# Patient Record
Sex: Female | Born: 1935 | Race: White | Hispanic: No | Smoking: Former smoker
Health system: Southern US, Community
[De-identification: ages and names within clinical notes are randomized; demographics above are authoritative.]

## PROBLEM LIST (undated history)

## (undated) DIAGNOSIS — R4182 Altered mental status, unspecified: Secondary | ICD-10-CM

## (undated) DIAGNOSIS — R0989 Other specified symptoms and signs involving the circulatory and respiratory systems: Secondary | ICD-10-CM

## (undated) DIAGNOSIS — M069 Rheumatoid arthritis, unspecified: Secondary | ICD-10-CM

## (undated) DIAGNOSIS — S72143A Displaced intertrochanteric fracture of unspecified femur, initial encounter for closed fracture: Secondary | ICD-10-CM

## (undated) DIAGNOSIS — K5900 Constipation, unspecified: Secondary | ICD-10-CM

## (undated) DIAGNOSIS — I10 Essential (primary) hypertension: Secondary | ICD-10-CM

## (undated) DIAGNOSIS — I491 Atrial premature depolarization: Secondary | ICD-10-CM

## (undated) DIAGNOSIS — J301 Allergic rhinitis due to pollen: Secondary | ICD-10-CM

## (undated) DIAGNOSIS — N390 Urinary tract infection, site not specified: Secondary | ICD-10-CM

## (undated) DIAGNOSIS — H25039 Anterior subcapsular polar age-related cataract, unspecified eye: Secondary | ICD-10-CM

## (undated) DIAGNOSIS — G309 Alzheimer's disease, unspecified: Secondary | ICD-10-CM

## (undated) DIAGNOSIS — E039 Hypothyroidism, unspecified: Secondary | ICD-10-CM

## (undated) DIAGNOSIS — R634 Abnormal weight loss: Secondary | ICD-10-CM

## (undated) DIAGNOSIS — R269 Unspecified abnormalities of gait and mobility: Secondary | ICD-10-CM

## (undated) DIAGNOSIS — M21619 Bunion of unspecified foot: Secondary | ICD-10-CM

## (undated) DIAGNOSIS — K624 Stenosis of anus and rectum: Secondary | ICD-10-CM

## (undated) DIAGNOSIS — K644 Residual hemorrhoidal skin tags: Secondary | ICD-10-CM

## (undated) DIAGNOSIS — R32 Unspecified urinary incontinence: Secondary | ICD-10-CM

## (undated) DIAGNOSIS — L989 Disorder of the skin and subcutaneous tissue, unspecified: Secondary | ICD-10-CM

## (undated) DIAGNOSIS — M545 Low back pain: Secondary | ICD-10-CM

## (undated) DIAGNOSIS — M5430 Sciatica, unspecified side: Secondary | ICD-10-CM

## (undated) DIAGNOSIS — M810 Age-related osteoporosis without current pathological fracture: Secondary | ICD-10-CM

## (undated) DIAGNOSIS — Z8719 Personal history of other diseases of the digestive system: Secondary | ICD-10-CM

## (undated) DIAGNOSIS — H3530 Unspecified macular degeneration: Secondary | ICD-10-CM

## (undated) DIAGNOSIS — F0280 Dementia in other diseases classified elsewhere without behavioral disturbance: Secondary | ICD-10-CM

## (undated) DIAGNOSIS — M8448XA Pathological fracture, other site, initial encounter for fracture: Secondary | ICD-10-CM

## (undated) DIAGNOSIS — M2040 Other hammer toe(s) (acquired), unspecified foot: Secondary | ICD-10-CM

## (undated) DIAGNOSIS — K602 Anal fissure, unspecified: Secondary | ICD-10-CM

## (undated) DIAGNOSIS — R627 Adult failure to thrive: Principal | ICD-10-CM

## (undated) DIAGNOSIS — K8020 Calculus of gallbladder without cholecystitis without obstruction: Secondary | ICD-10-CM

## (undated) HISTORY — DX: Unspecified abnormalities of gait and mobility: R26.9

## (undated) HISTORY — DX: Essential (primary) hypertension: I10

## (undated) HISTORY — DX: Age-related osteoporosis without current pathological fracture: M81.0

## (undated) HISTORY — DX: Anal fissure, unspecified: K60.2

## (undated) HISTORY — DX: Displaced intertrochanteric fracture of unspecified femur, initial encounter for closed fracture: S72.143A

## (undated) HISTORY — DX: Alzheimer's disease, unspecified: G30.9

## (undated) HISTORY — DX: Calculus of gallbladder without cholecystitis without obstruction: K80.20

## (undated) HISTORY — DX: Anterior subcapsular polar age-related cataract, unspecified eye: H25.039

## (undated) HISTORY — DX: Unspecified urinary incontinence: R32

## (undated) HISTORY — DX: Atrial premature depolarization: I49.1

## (undated) HISTORY — DX: Abnormal weight loss: R63.4

## (undated) HISTORY — DX: Disorder of the skin and subcutaneous tissue, unspecified: L98.9

## (undated) HISTORY — DX: Bunion of unspecified foot: M21.619

## (undated) HISTORY — DX: Sciatica, unspecified side: M54.30

## (undated) HISTORY — DX: Dementia in other diseases classified elsewhere without behavioral disturbance: F02.80

## (undated) HISTORY — DX: Rheumatoid arthritis, unspecified: M06.9

## (undated) HISTORY — DX: Constipation, unspecified: K59.00

## (undated) HISTORY — DX: Residual hemorrhoidal skin tags: K64.4

## (undated) HISTORY — DX: Pathological fracture, other site, initial encounter for fracture: M84.48XA

## (undated) HISTORY — DX: Hypothyroidism, unspecified: E03.9

## (undated) HISTORY — DX: Low back pain: M54.5

## (undated) HISTORY — DX: Stenosis of anus and rectum: K62.4

## (undated) HISTORY — DX: Personal history of other diseases of the digestive system: Z87.19

## (undated) HISTORY — DX: Allergic rhinitis due to pollen: J30.1

## (undated) HISTORY — DX: Adult failure to thrive: R62.7

## (undated) HISTORY — DX: Unspecified macular degeneration: H35.30

## (undated) HISTORY — DX: Other specified symptoms and signs involving the circulatory and respiratory systems: R09.89

## (undated) HISTORY — DX: Urinary tract infection, site not specified: N39.0

## (undated) HISTORY — DX: Other hammer toe(s) (acquired), unspecified foot: M20.40

## (undated) HISTORY — DX: Altered mental status, unspecified: R41.82

---

## 1980-06-18 HISTORY — PX: HAMMER TOE SURGERY: SHX385

## 2004-06-18 DIAGNOSIS — M2040 Other hammer toe(s) (acquired), unspecified foot: Secondary | ICD-10-CM

## 2004-06-18 DIAGNOSIS — H25039 Anterior subcapsular polar age-related cataract, unspecified eye: Secondary | ICD-10-CM

## 2004-06-18 DIAGNOSIS — K5900 Constipation, unspecified: Secondary | ICD-10-CM

## 2004-06-18 DIAGNOSIS — E039 Hypothyroidism, unspecified: Secondary | ICD-10-CM

## 2004-06-18 DIAGNOSIS — I10 Essential (primary) hypertension: Secondary | ICD-10-CM

## 2004-06-18 DIAGNOSIS — K624 Stenosis of anus and rectum: Secondary | ICD-10-CM

## 2004-06-18 HISTORY — DX: Hypothyroidism, unspecified: E03.9

## 2004-06-18 HISTORY — DX: Constipation, unspecified: K59.00

## 2004-06-18 HISTORY — DX: Other hammer toe(s) (acquired), unspecified foot: M20.40

## 2004-06-18 HISTORY — DX: Stenosis of anus and rectum: K62.4

## 2004-06-18 HISTORY — DX: Essential (primary) hypertension: I10

## 2004-06-18 HISTORY — DX: Anterior subcapsular polar age-related cataract, unspecified eye: H25.039

## 2005-06-18 DIAGNOSIS — M810 Age-related osteoporosis without current pathological fracture: Secondary | ICD-10-CM

## 2005-06-18 DIAGNOSIS — J301 Allergic rhinitis due to pollen: Secondary | ICD-10-CM

## 2005-06-18 HISTORY — DX: Age-related osteoporosis without current pathological fracture: M81.0

## 2005-06-18 HISTORY — DX: Allergic rhinitis due to pollen: J30.1

## 2005-06-18 HISTORY — PX: CHOLECYSTECTOMY, LAPAROSCOPIC: SHX56

## 2005-10-01 ENCOUNTER — Encounter (INDEPENDENT_AMBULATORY_CARE_PROVIDER_SITE_OTHER): Payer: Self-pay | Admitting: *Deleted

## 2005-10-01 ENCOUNTER — Ambulatory Visit (HOSPITAL_COMMUNITY): Admission: RE | Admit: 2005-10-01 | Discharge: 2005-10-02 | Payer: Self-pay | Admitting: Surgery

## 2005-10-16 DIAGNOSIS — K8020 Calculus of gallbladder without cholecystitis without obstruction: Secondary | ICD-10-CM

## 2005-10-16 HISTORY — DX: Calculus of gallbladder without cholecystitis without obstruction: K80.20

## 2007-01-13 ENCOUNTER — Encounter (INDEPENDENT_AMBULATORY_CARE_PROVIDER_SITE_OTHER): Payer: Self-pay | Admitting: Surgery

## 2007-01-13 ENCOUNTER — Ambulatory Visit (HOSPITAL_COMMUNITY): Admission: RE | Admit: 2007-01-13 | Discharge: 2007-01-13 | Payer: Self-pay | Admitting: Surgery

## 2007-06-19 DIAGNOSIS — I491 Atrial premature depolarization: Secondary | ICD-10-CM

## 2007-06-19 DIAGNOSIS — S72143A Displaced intertrochanteric fracture of unspecified femur, initial encounter for closed fracture: Secondary | ICD-10-CM

## 2007-06-19 HISTORY — DX: Atrial premature depolarization: I49.1

## 2007-06-19 HISTORY — DX: Displaced intertrochanteric fracture of unspecified femur, initial encounter for closed fracture: S72.143A

## 2007-08-17 HISTORY — PX: ORIF HIP FRACTURE: SHX2125

## 2007-08-21 ENCOUNTER — Inpatient Hospital Stay (HOSPITAL_COMMUNITY): Admission: EM | Admit: 2007-08-21 | Discharge: 2007-08-26 | Payer: Self-pay | Admitting: Emergency Medicine

## 2008-03-26 ENCOUNTER — Ambulatory Visit: Payer: Self-pay | Admitting: Vascular Surgery

## 2008-06-18 DIAGNOSIS — M5430 Sciatica, unspecified side: Secondary | ICD-10-CM

## 2008-06-18 DIAGNOSIS — M8448XA Pathological fracture, other site, initial encounter for fracture: Secondary | ICD-10-CM

## 2008-06-18 HISTORY — DX: Sciatica, unspecified side: M54.30

## 2008-06-18 HISTORY — DX: Pathological fracture, other site, initial encounter for fracture: M84.48XA

## 2009-05-18 HISTORY — PX: WRIST ARTHROTOMY: SHX1089

## 2009-06-08 ENCOUNTER — Encounter: Admission: RE | Admit: 2009-06-08 | Discharge: 2009-06-08 | Payer: Self-pay | Admitting: Internal Medicine

## 2009-06-09 ENCOUNTER — Inpatient Hospital Stay (HOSPITAL_COMMUNITY): Admission: EM | Admit: 2009-06-09 | Discharge: 2009-06-14 | Payer: Self-pay | Admitting: Orthopaedic Surgery

## 2009-06-09 DIAGNOSIS — M009 Pyogenic arthritis, unspecified: Secondary | ICD-10-CM

## 2009-06-12 ENCOUNTER — Ambulatory Visit: Payer: Self-pay | Admitting: Infectious Diseases

## 2009-06-18 DIAGNOSIS — M069 Rheumatoid arthritis, unspecified: Secondary | ICD-10-CM

## 2009-06-18 DIAGNOSIS — K644 Residual hemorrhoidal skin tags: Secondary | ICD-10-CM

## 2009-06-18 HISTORY — DX: Residual hemorrhoidal skin tags: K64.4

## 2009-06-18 HISTORY — DX: Rheumatoid arthritis, unspecified: M06.9

## 2009-07-13 ENCOUNTER — Ambulatory Visit: Payer: Self-pay | Admitting: Infectious Diseases

## 2009-07-13 DIAGNOSIS — E039 Hypothyroidism, unspecified: Secondary | ICD-10-CM | POA: Insufficient documentation

## 2009-07-13 DIAGNOSIS — I10 Essential (primary) hypertension: Secondary | ICD-10-CM | POA: Insufficient documentation

## 2009-07-13 DIAGNOSIS — S72009A Fracture of unspecified part of neck of unspecified femur, initial encounter for closed fracture: Secondary | ICD-10-CM | POA: Insufficient documentation

## 2010-01-22 ENCOUNTER — Inpatient Hospital Stay (HOSPITAL_COMMUNITY)
Admission: EM | Admit: 2010-01-22 | Discharge: 2010-01-24 | Payer: Self-pay | Source: Home / Self Care | Admitting: Emergency Medicine

## 2010-01-23 ENCOUNTER — Encounter (INDEPENDENT_AMBULATORY_CARE_PROVIDER_SITE_OTHER): Payer: Self-pay | Admitting: Family Medicine

## 2010-01-23 ENCOUNTER — Ambulatory Visit: Payer: Self-pay | Admitting: Vascular Surgery

## 2010-01-24 ENCOUNTER — Encounter (INDEPENDENT_AMBULATORY_CARE_PROVIDER_SITE_OTHER): Payer: Self-pay | Admitting: Family Medicine

## 2010-06-18 DIAGNOSIS — K602 Anal fissure, unspecified: Secondary | ICD-10-CM

## 2010-06-18 DIAGNOSIS — R269 Unspecified abnormalities of gait and mobility: Secondary | ICD-10-CM

## 2010-06-18 HISTORY — DX: Unspecified abnormalities of gait and mobility: R26.9

## 2010-06-18 HISTORY — DX: Anal fissure, unspecified: K60.2

## 2010-07-18 NOTE — Assessment & Plan Note (Signed)
Summary: [facility] NEED CHART WRIST INFECTION/[facility] 12INPT SER   CC:  [facility].  History of Present Illness: 77 yo F with adm to [facility] from SNF with R wrist pyarthrosis. She had an arthrocentesis in office and was admitted and uncerwent I & D 06-09-09. She was d/c to snf to complete 21 days of IV anbx (vanco and ceftriaxone).  Has completed antibiotics, pic line is out, no fevers or chills, can move wrist normally, no pain,   Preventive Screening-Counseling & Management  Alcohol-Tobacco     Alcohol drinks/day: 1     Alcohol type: wine     Smoking Status: never   Updated Prior Medication List: MIRALAX  POWD (POLYETHYLENE GLYCOL 3350) use as directed ADVIL 200 MG TABS (IBUPROFEN) take one tablet daily METOPROLOL TARTRATE 25 MG TABS (METOPROLOL TARTRATE) take one half  two times a day SM CALCIUM CITRATE-VIT D 315-200 MG-UNIT TABS (CALCIUM CITRATE-VITAMIN D) take one daily NORVASC 5 MG TABS (AMLODIPINE BESYLATE) take one dose daily ASPIR-LOW 81 MG TBEC (ASPIRIN) take one daily HYDROCHLOROTHIAZIDE 25 MG TABS (HYDROCHLOROTHIAZIDE) take one daily LEVOTHROID 50 MCG TABS (LEVOTHYROXINE SODIUM) take one tablet each am MULTIVITAMINS  CAPS (MULTIPLE VITAMIN) take one capsule daily  Current Allergies (reviewed today): ! CODEINE Past History:  Past Medical History: Current Problems:  PYOGENIC ARTHRITIS, FOREARM (ICD-711.03) UNSPECIFIED HYPOTHYROIDISM (ICD-244.9) HIP FRACTURE, RIGHT (ICD-820.8) ESSENTIAL HYPERTENSION (ICD-401.9)  Current Medications (verified): 1)  Miralax  Powd (Polyethylene Glycol 3350) .... Use As Directed 2)  Advil 200 Mg Tabs (Ibuprofen) .... Take One Tablet Daily 3)  Metoprolol Tartrate 25 Mg Tabs (Metoprolol Tartrate) .... Take One Half  Two Times A Day 4)  Sm Calcium Citrate-Vit D 315-200 Mg-Unit Tabs (Calcium Citrate-Vitamin D) .... Take One Daily 5)  Norvasc 5 Mg Tabs (Amlodipine Besylate) .... Take One Dose Daily 6)  Aspir-Low 81 Mg Tbec (Aspirin) .... Take One Daily 7)   Hydrochlorothiazide 25 Mg Tabs (Hydrochlorothiazide) .... Take One Daily 8)  Levothroid 50 Mcg Tabs (Levothyroxine Sodium) .... Take One Tablet Each Am 9)  Multivitamins  Caps (Multiple Vitamin) .... Take One Capsule Daily  Allergies (verified): 1)  ! Codeine   Family History: non-contributory  Social History: Never Smoked Retired lives in independent living SNF Alcohol use-yes, 1 glass of wine/night.  has a cat  Vital Signs:  Patient profile:   77 year old female Height:      0 inches (0 cm) Weight:      0 pounds (0 kg) Temp:     96.7 degrees F (35.94 degrees C) oral Pulse rate:   74 / minute BP sitting:   153 / 51  (left arm)  Vitals Entered By: Starleen Arms CMA (July 13, 2009 4:02 PM) CC: [facility] Is Patient Diabetic? No Pain Assessment Patient in pain? no       Does patient need assistance? Functional Status Self care Ambulation Impaired:Risk for fall, Wheelchair   Physical Exam  General:  well-developed, well-nourished, and well-hydrated.   Eyes:  pupils equal, pupils round, and pupils reactive to light.   Mouth:  pharynx pink and moist and no exudates.   Neck:  no masses.   Lungs:  normal respiratory effort and normal breath sounds.   Heart:  normal rate and regular rhythm.   Abdomen:  soft, non-tender, and normal bowel sounds.   Extremities:  R wrist- well healed, nontender, no fluctuance, no erythema.    Impression & Recommendations:  Problem # 1:  PYOGENIC ARTHRITIS, FOREARM (ICD-711.03)  she is doing  very well. has completed her antibiotics and has healed beautifully. She needs no further antibiotics.  Spoke with her that the etiology of this was, unfortunately, unknown. She will f/u with me as needed.  Her updated medication list for this problem includes:    Aspir-low 81 Mg Tbec (Aspirin) .Marland Kitchen... Take one daily  Orders: New Patient Level III ([address])  Medications Added to Medication List This Visit: 1)  Miralax Powd (Polyethylene glycol  3350) .... Use as directed 2)  Advil 200 Mg Tabs (Ibuprofen) .... Take one tablet daily 3)  Metoprolol Tartrate 25 Mg Tabs (Metoprolol tartrate) .... Take one half  two times a day 4)  Sm Calcium Citrate-vit D 315-200 Mg-unit Tabs (Calcium citrate-vitamin d) .... Take one daily 5)  Norvasc 5 Mg Tabs (Amlodipine besylate) .... Take one dose daily 6)  Aspir-low 81 Mg Tbec (Aspirin) .... Take one daily 7)  Hydrochlorothiazide 25 Mg Tabs (Hydrochlorothiazide) .... Take one daily 8)  Levothroid 50 Mcg Tabs (Levothyroxine sodium) .... Take one tablet each am 9)  Multivitamins Caps (Multiple vitamin) .... Take one capsule daily

## 2010-07-18 NOTE — Miscellaneous (Signed)
Summary: HIPAA Restrictions  HIPAA Restrictions   Imported By: Florinda Marker 07/13/2009 16:15:51  _____________________________________________________________________  External Attachment:    Type:   Image     Comment:   External Document

## 2010-08-11 IMAGING — CR DG SHOULDER 2+V*R*
3 series · 3 of 3 positions shown · non-contrast
Comparison: None

CLINICAL DATA: Anterior shoulder pain.

RIGHT SHOULDER - 2+ VIEW

[view not recorded (1 of 3)]
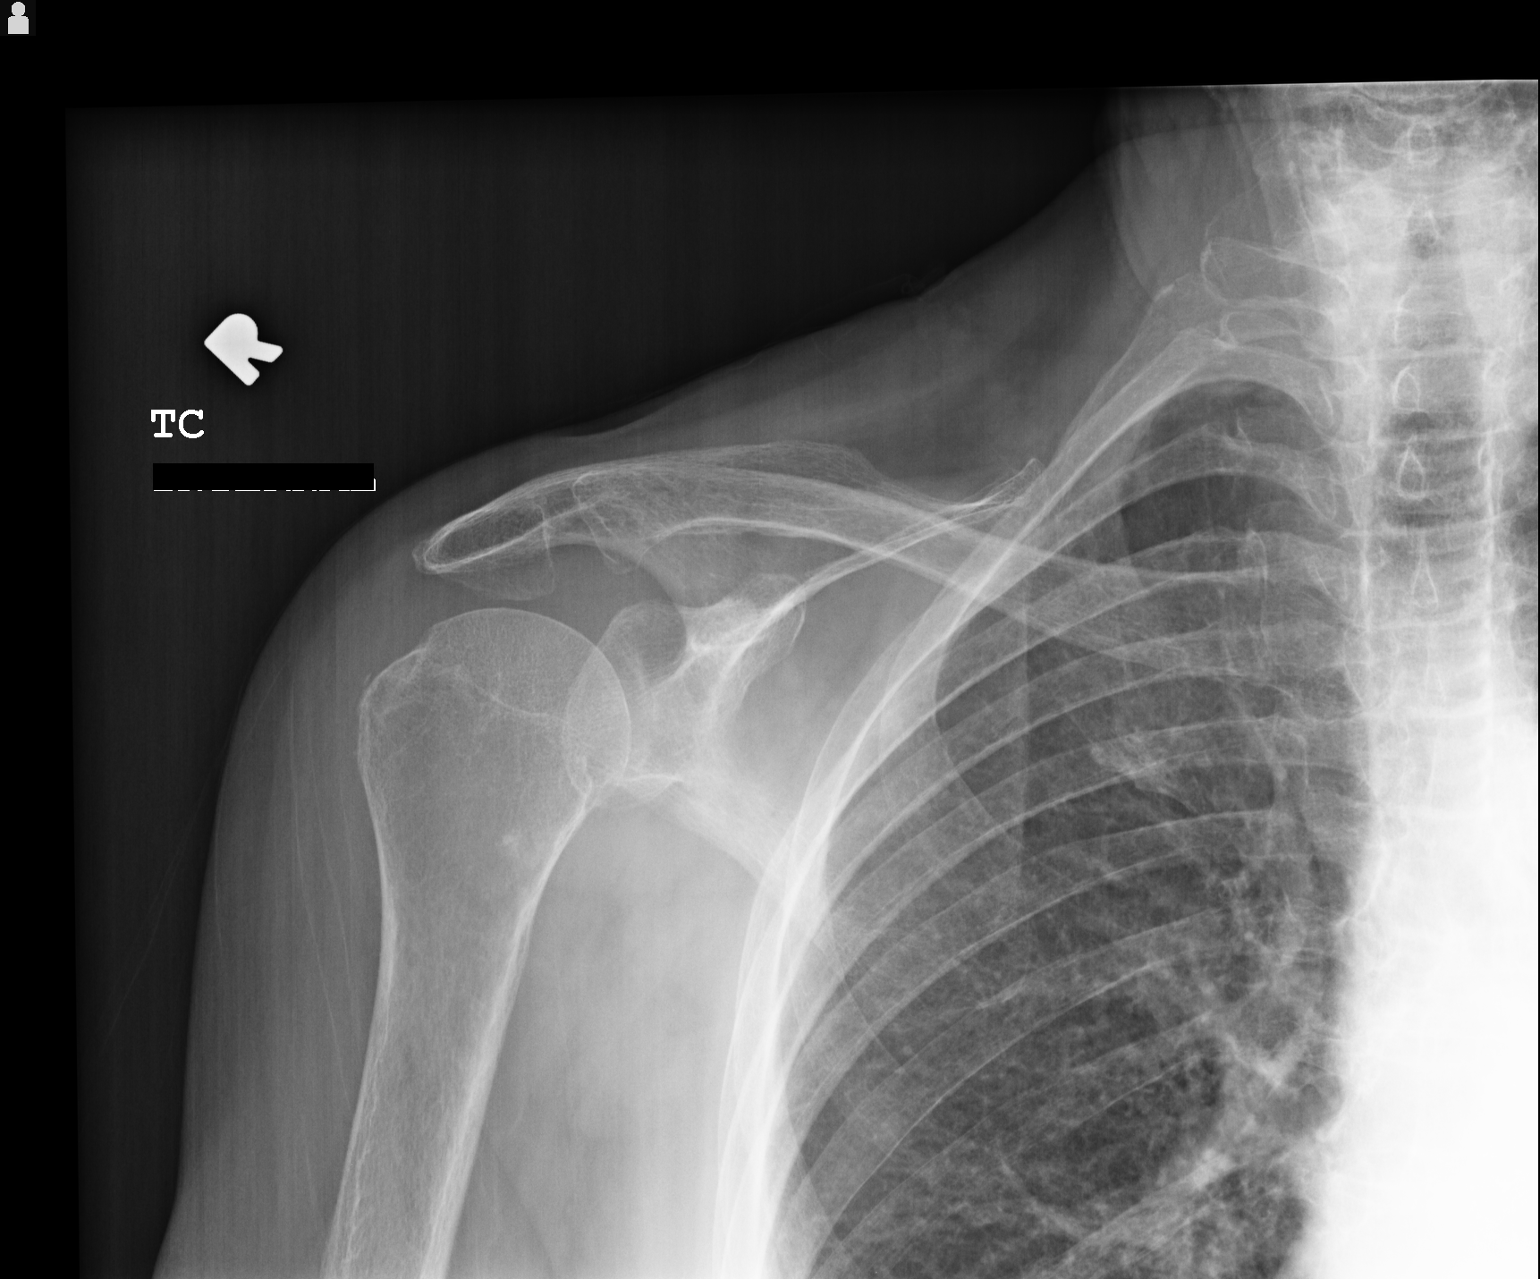

[view not recorded (2 of 3)]
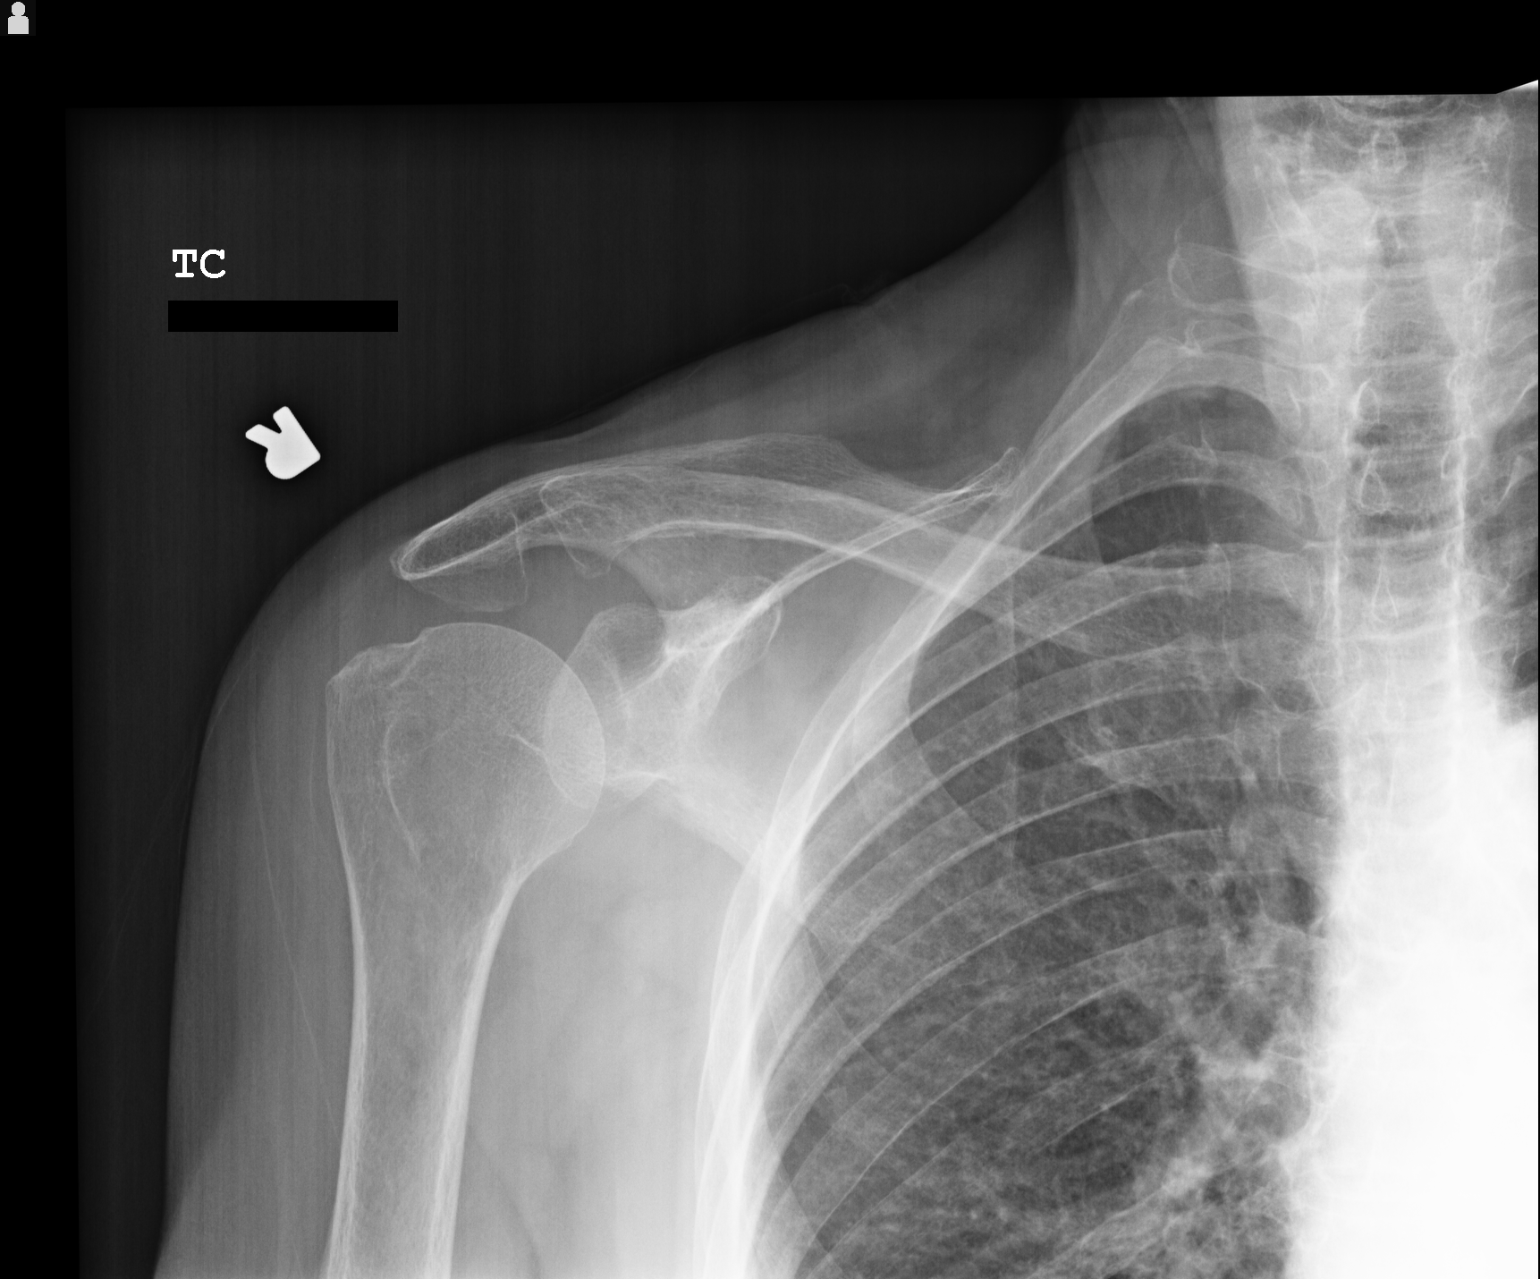

[view not recorded (3 of 3)]
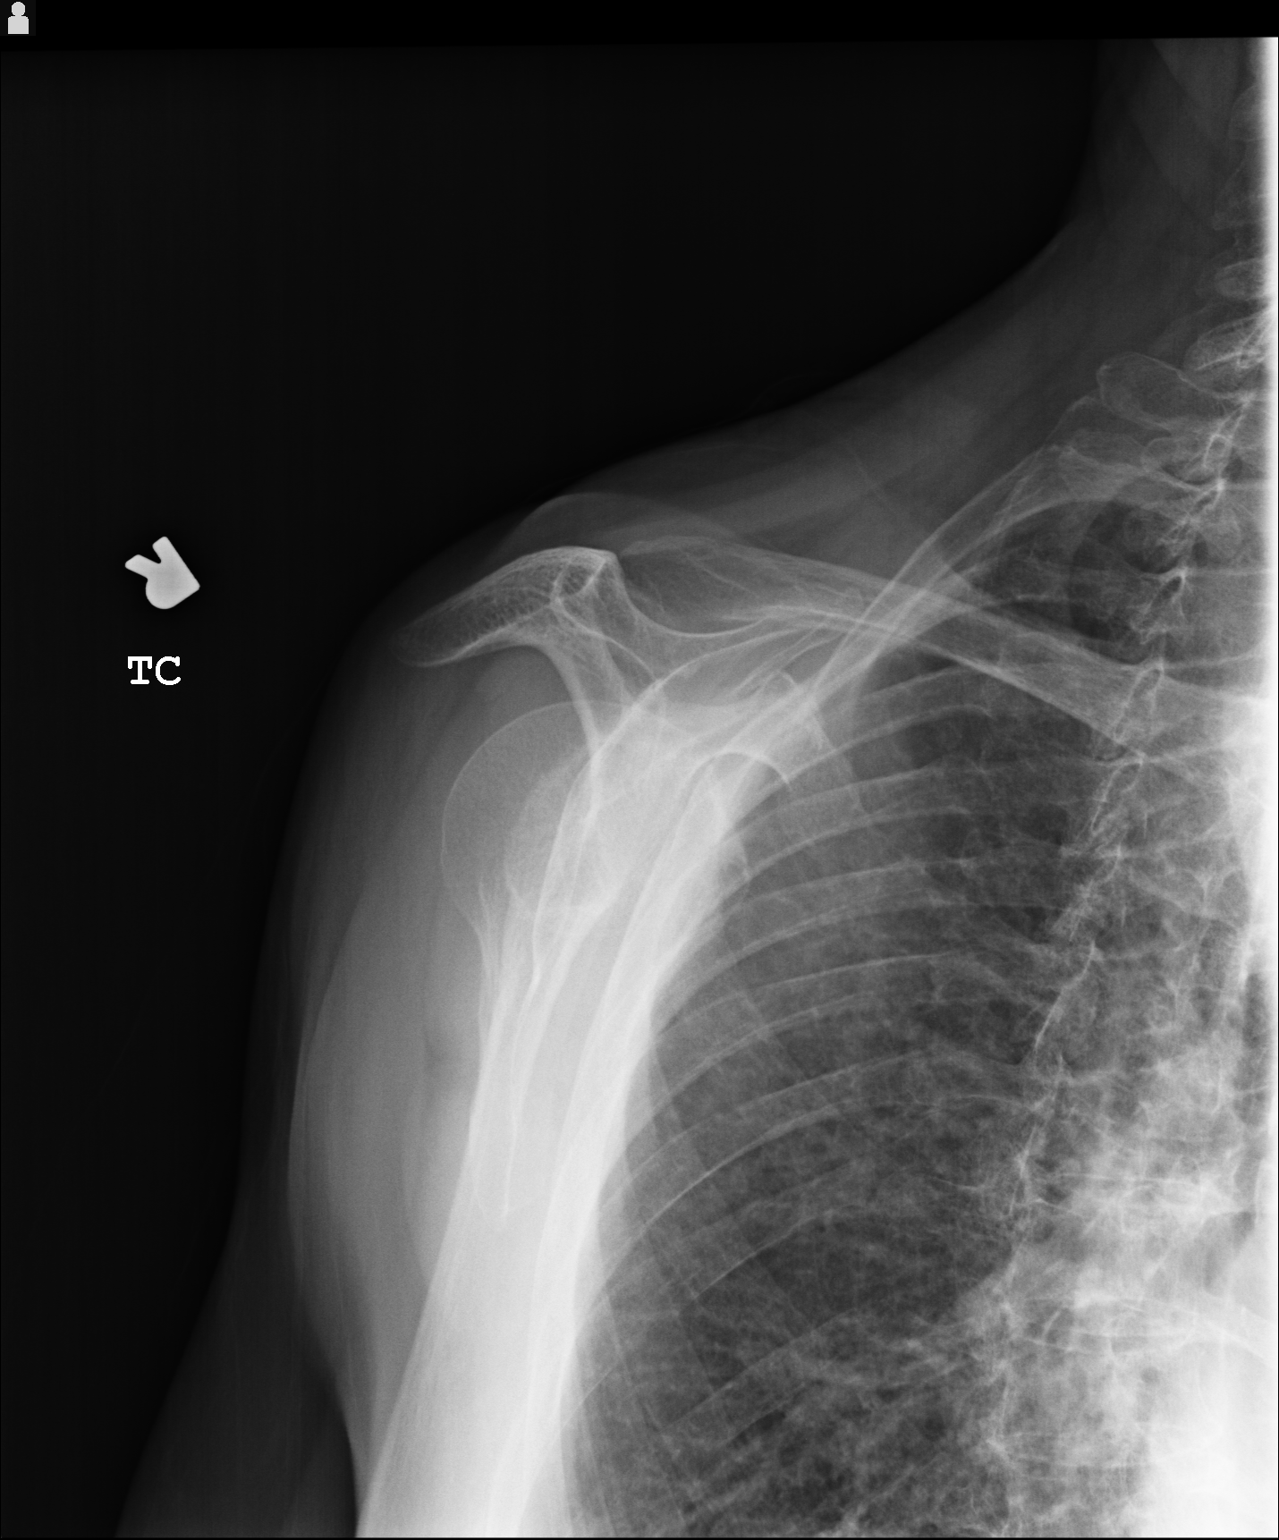

[3 of 3 positions shown; findings below may reference images not displayed]

FINDINGS: There is slight apparent widening of the right AC joint.
Recommend clinical correlation to exclude separation.  No fracture
or dislocation.  Mild osteopenia.  No significant degenerative
changes.  Visualized right hemithorax unremarkable.
IMPRESSION: Mild widening at the right AC joint.  Recommend clinical
correlation for trauma to exclude AC joint separation.

## 2010-09-01 LAB — DIFFERENTIAL
Basophils Absolute: 0 10*3/uL (ref 0.0–0.1)
Basophils Relative: 0 % (ref 0–1)
Eosinophils Absolute: 0 10*3/uL (ref 0.0–0.7)
Lymphocytes Relative: 15 % (ref 12–46)
Lymphs Abs: 1 10*3/uL (ref 0.7–4.0)
Monocytes Absolute: 0.4 10*3/uL (ref 0.1–1.0)
Monocytes Relative: 5 % (ref 3–12)
Monocytes Relative: 9 % (ref 3–12)
Neutro Abs: 5.4 10*3/uL (ref 1.7–7.7)
Neutrophils Relative %: 62 % (ref 43–77)
Neutrophils Relative %: 80 % — ABNORMAL HIGH (ref 43–77)

## 2010-09-01 LAB — BASIC METABOLIC PANEL
BUN: 12 mg/dL (ref 6–23)
BUN: 12 mg/dL (ref 6–23)
BUN: 16 mg/dL (ref 6–23)
CO2: 25 mEq/L (ref 19–32)
Calcium: 8.9 mg/dL (ref 8.4–10.5)
Calcium: 9.1 mg/dL (ref 8.4–10.5)
Chloride: 95 mEq/L — ABNORMAL LOW (ref 96–112)
Creatinine, Ser: 0.78 mg/dL (ref 0.4–1.2)
Creatinine, Ser: 0.78 mg/dL (ref 0.4–1.2)
Creatinine, Ser: 0.89 mg/dL (ref 0.4–1.2)
GFR calc Af Amer: >60 mL/min (ref >60)
GFR calc non Af Amer: >60 mL/min (ref >60)
Glucose, Bld: 75 mg/dL (ref 70–99)
Glucose, Bld: 91 mg/dL (ref 70–99)
Potassium: 3.5 mEq/L (ref 3.5–5.1)
Potassium: 4.8 mEq/L (ref 3.5–5.1)

## 2010-09-01 LAB — CARDIAC PANEL(CRET KIN+CKTOT+MB+TROPI)
CK, MB: 1.3 ng/mL (ref 0.3–4.0)
Relative Index: INVALID (ref 0.0–2.5)
Relative Index: INVALID (ref 0.0–2.5)
Total CK: 30 U/L (ref 7–177)
Troponin I: 0.02 ng/mL (ref 0.00–0.06)
Troponin I: 0.02 ng/mL (ref 0.00–0.06)

## 2010-09-01 LAB — CBC
Hemoglobin: 10.5 g/dL — ABNORMAL LOW (ref 12.0–15.0)
MCHC: 34.5 g/dL (ref 30.0–36.0)
MCHC: 34.7 g/dL (ref 30.0–36.0)
Platelets: 246 10*3/uL (ref 150–400)
Platelets: 270 10*3/uL (ref 150–400)
RBC: 3.14 MIL/uL — ABNORMAL LOW (ref 3.87–5.11)
RBC: 3.5 MIL/uL — ABNORMAL LOW (ref 3.87–5.11)
RDW: 14.3 % (ref 11.5–15.5)

## 2010-09-01 LAB — CK TOTAL AND CKMB (NOT AT ARMC): Total CK: 33 U/L (ref 7–177)

## 2010-09-01 LAB — URINALYSIS, ROUTINE W REFLEX MICROSCOPIC
Hgb urine dipstick: NEGATIVE
Ketones, ur: NEGATIVE mg/dL
Protein, ur: NEGATIVE mg/dL
Specific Gravity, Urine: 1.007 (ref 1.005–1.030)

## 2010-09-01 LAB — MRSA PCR SCREENING: MRSA by PCR: NEGATIVE

## 2010-09-01 LAB — TSH: TSH: 2.005 u[IU]/mL (ref 0.350–4.500)

## 2010-09-01 LAB — TROPONIN I: Troponin I: 0.01 ng/mL (ref 0.00–0.06)

## 2010-09-01 LAB — LIPID PANEL: Total CHOL/HDL Ratio: 2.5 RATIO

## 2010-09-17 DIAGNOSIS — H3530 Unspecified macular degeneration: Secondary | ICD-10-CM

## 2010-09-17 HISTORY — DX: Unspecified macular degeneration: H35.30

## 2010-09-18 LAB — BASIC METABOLIC PANEL
BUN: 7 mg/dL (ref 6–23)
BUN: 9 mg/dL (ref 6–23)
CO2: 29 mEq/L (ref 19–32)
Chloride: 88 mEq/L — ABNORMAL LOW (ref 96–112)
Chloride: 92 mEq/L — ABNORMAL LOW (ref 96–112)
Creatinine, Ser: 0.69 mg/dL (ref 0.4–1.2)
Creatinine, Ser: 0.74 mg/dL (ref 0.4–1.2)
GFR calc Af Amer: >60 mL/min (ref >60)
GFR calc Af Amer: >60 mL/min (ref >60)
GFR calc non Af Amer: >60 mL/min (ref >60)
GFR calc non Af Amer: >60 mL/min (ref >60)
Glucose, Bld: 98 mg/dL (ref 70–99)
Potassium: 2.8 mEq/L — ABNORMAL LOW (ref 3.5–5.1)
Potassium: 3.7 mEq/L (ref 3.5–5.1)

## 2010-09-18 LAB — CBC
HCT: 31.2 % — ABNORMAL LOW (ref 36.0–46.0)
HCT: 31.9 % — ABNORMAL LOW (ref 36.0–46.0)
HCT: 32.5 % — ABNORMAL LOW (ref 36.0–46.0)
Hemoglobin: 11.2 g/dL — ABNORMAL LOW (ref 12.0–15.0)
MCHC: 35.1 g/dL (ref 30.0–36.0)
MCV: 100 fL (ref 78.0–100.0)
MCV: 100.4 fL — ABNORMAL HIGH (ref 78.0–100.0)
Platelets: 254 10*3/uL (ref 150–400)
Platelets: 255 10*3/uL (ref 150–400)
RBC: 3.19 MIL/uL — ABNORMAL LOW (ref 3.87–5.11)
RBC: 3.24 MIL/uL — ABNORMAL LOW (ref 3.87–5.11)
RDW: 12.9 % (ref 11.5–15.5)
WBC: 10.9 10*3/uL — ABNORMAL HIGH (ref 4.0–10.5)
WBC: 13.1 10*3/uL — ABNORMAL HIGH (ref 4.0–10.5)
WBC: 8.3 10*3/uL (ref 4.0–10.5)

## 2010-09-18 LAB — ANAEROBIC CULTURE: Gram Stain: NONE SEEN

## 2010-09-18 LAB — RHEUMATOID FACTOR: Rhuematoid fact SerPl-aCnc: 20 IU/mL (ref 0–20)

## 2010-09-18 LAB — URIC ACID: Uric Acid, Serum: 2.5 mg/dL (ref 2.4–7.0)

## 2010-09-18 LAB — BODY FLUID CULTURE

## 2010-10-31 NOTE — Discharge Summary (Signed)
[name], [name]             ACCOUNT NO.:  [id number]   MEDICAL RECORD NO.:  [id number]          PATIENT TYPE:  INP   LOCATION:  1605                         FACILITY:  [facility]   PHYSICIAN:  Mark C. Ophelia Charter, M.D.    DATE OF BIRTH:  1924/05/20   DATE OF ADMISSION:  08/20/2007  DATE OF DISCHARGE:  08/26/2007                               DISCHARGE SUMMARY   FINAL DIAGNOSIS:  Fall with left intertrochanteric/subtrochanteric  comminuted fracture.   ADDITIONAL DIAGNOSES:  1. Acute blood loss anemia secondary to fracture.  2. Status post cholecystectomy.  3. Hammertoe surgery.  4. Hypothyroidism.  5. Osteoporosis.   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  She was at [employer] for the [employer] and caught her heel on the edge of carpet, suffering a  fall with a left intertrochanteric/subtrochanteric comminuted fracture.  She presented to the emergency room by ambulance with fracture.  Chest x-  ray showed some mild bibasilar atelectasis.   ADMISSION LABORATORY DATA:  Included normal chemistry panel with the  exception of a potassium of 3.1, glucose of 121 and a hemoglobin was  11.8 with a normal white count and normal differential; platelets were  291,000.   An EKG showed normal sinus rhythm with premature atrial complexes.   ADMISSION MEDICATIONS:  1. Synthroid 0.05 mg p.o. daily.  2. Hydrochlorothiazide 25 mg daily.  3. Multivitamins one a day.  4. Calcium 600 mg b.i.d.  5. Boniva once a month.  6. Norvasc 5 mg p.o. daily.   HOSPITAL COURSE:  The patient was admitted and after informed consent,  was taken to the operating room and underwent left intramedullary hip  screw fixation with Synthes device, 11 x 360-mm nail with proximal and  distal interlock; this a long rod and it was interlocked distally just  above the knee.  A 95-mm lag screw was used.  EBL intraoperatively was  400 mL.  General  anesthesia plus 15 mL of local.  Postoperatively, the  patient had some decreased urine output and 2 units of blood were given  for a low hemoglobin of 6.9 with acute blood loss anemia.  Oxygen was  left on and after 2 units, hemoglobin improved.  She made slow progress  with physical therapy and initially refused to have her catheter removed  and it was discussed that she would be a risk for UTI, etc.  Hemoglobin  was 9.1 and stable on August 24, 2007.  She was ambulatory to the hall and  back.  She was on Coumadin and received 4 mg and INR was 1.6 on August 25, 2007.  Plan was for discharge on 5 mg of Coumadin daily for 3 weeks,  which will stop on April 1.  She will receive ferrous sulfate 300 mg  p.o. b.i.d. until April 1 and also Colace stool softener.  She is taking  Percocet for pain.  She had a small amount of bloody drainage from her  incision the first couple of days postop; incision was dry on August 25, 2007.  The nurse at [facility] is to remove her staples in her hip at  the trochanter as well as just below the trochanter in the mid thigh and  also just above the knee laterally on September 02, 2007.  All staples are  to be removed on that date.  She is off aspirin until her Coumadin stops  on September 17, 2007 and then she can restart her 1 baby aspirin a day as  she previously was prescribed.  Office followup with Dr. Ophelia Charter for 3  weeks.  Postop x-ray showed good position and alignment.  In 3 weeks,  she will see Dr. Ophelia Charter in his office (office number is [phone]), [address] for followup x-rays and wound check.  Foley was removed  on August 25, 2007.  She will be on in-and-out q.6 h. if needed.  Hopefully, she will void without problems and will be discharged Tuesday  morning, August 26, 2007, after she works with Physical Therapy.   CONDITION AT DISCHARGE:  Improved.      Mark C. Ophelia Charter, M.D.  Electronically Signed     MCY/MEDQ  D:  08/25/2007  T:  08/26/2007  Job:   [id number]

## 2010-10-31 NOTE — Op Note (Signed)
[name], [name]             ACCOUNT NO.:  [id number]   MEDICAL RECORD NO.:  [id number]          PATIENT TYPE:  INP   LOCATION:  1518                         FACILITY:  [facility]   PHYSICIAN:  Mark C. Ophelia Charter, M.D.    DATE OF BIRTH:  [birth year]   DATE OF PROCEDURE:  08/21/2007  DATE OF DISCHARGE:                               OPERATIVE REPORT   PREOPERATIVE DIAGNOSES:  Left _intertrochanteric  subtrochanteric femur  fracture, comminuted.   POSTOPERATIVE DIAGNOSES:  Left intertrochanteric subtrochanteric femur  fracture, comminuted.   PROCEDURE:  Left intramedullary hip screw Synthes 11 x 360 nail with 95  mm interlock and distal interlock x2.   SURGEON:  Annell Greening, MD.   ESTIMATED BLOOD LOSS:  400 mL.   ANESTHESIA:  GOT plus 15 mL local.   COMPLICATIONS:  None.   DESCRIPTION OF PROCEDURE:  After induction of general anesthesia,  orotracheal intubation, the patient was placed on the fracture table  with traction applied, well leg holder, arms across the chest with  padding.  The leg was placed in traction, careful padding, distracted,  internally rotated, adducted and x-rays taken which demonstrated  reduction procedure was suggested. Lateral fluoroscopic view was  confirmed.  A time-out procedure was taken, hip was prepped with  DuraPrep, area was covered with towels.  A large __________ and Vi-Drape  was applied and then time out procedure repeated. A lateral incision was  made starting at the trochanter, extended proximally, the gluteus medius  fascia was split and K-wire was drilled in the trochanter.  Over reaming  for entry point and then placement of 360 rod based on the ruler placed  over the top of the hip.  The nail was passed across through the  comminuted subtrochanteric fracture area initially slightly posterior  and then aimed more anterior and then went directly down to the  fragments and ended up at the superior pole the patellar region. The rod  was driven  until it was in position where the line was even with the  inferior aspect of the neck. A lateral incision was made in the skin and  subtrochanteric region and the large screw was clamped down until under  fluoroscopy it was against the bone. The K wire was drilled up. It was  about in the middle of the head on AP and slightly posterior on the  lateral. Measurement of 95, screw was inserted, locked proximally and  then the aiming guide was removed. A lateral incision was made distally  under fluoroscopic visualization and freehand technique was used for  placing the two interlocks. The one through the slot went slightly  oblique and slightly posterior, we went through the slot and it was  tight. A transverse screw was placed and initially it was just above the  rod and then when it was backed out repositioned and then  went directly  through the rod. AP and lateral fluoroscopic pictures taken that  confirmed good position.  The fracture site  looked good.  After irrigation with saline solution, the deep fascia was  closed with #0 Vicryl,  2-0 Vicryl  in the subcutaneous tissue, skin  stapled closure, Marcaine infiltration in the skin,  postop dressing.  Instrument count and needle count was correct.      Mark C. Ophelia Charter, M.D.  Electronically Signed     MCY/MEDQ  D:  08/21/2007  T:  08/22/2007  Job:  [id number]

## 2010-10-31 NOTE — Procedures (Signed)
CAROTID DUPLEX EXAM   INDICATION:  Carotid bruit.   HISTORY:  Diabetes:  No.  Cardiac:  Atrial fibrillation.  Hypertension:  No.  Smoking:  Previous.  Previous Surgery:  CV History:  Amaurosis Fugax No, Paresthesias No, Hemiparesis No.                                       RIGHT             LEFT  Brachial systolic pressure:         180               174  Brachial Doppler waveforms:         Normal            Normal  Vertebral direction of flow:        Antegrade         Antegrade  DUPLEX VELOCITIES (cm/sec)  CCA peak systolic                   95                89  ECA peak systolic                   198               80  ICA peak systolic                   206               76  ICA end diastolic                   55                20  PLAQUE MORPHOLOGY:                  Calcific          Calcific  PLAQUE AMOUNT:                      Moderate          Mild  PLAQUE LOCATION:                    ICA/ECA           ICA/distal CCA   IMPRESSION:  1. Doppler velocities suggest a low-end 60-79% stenosis of the right      proximal-to-mid internal carotid artery which appears to be a      result of moderate plaque formations and vessel tortuosity.  2. 1-39% stenosis of the left internal carotid artery.   Preliminary report was faxed to [facility] on 03/26/08.   ___________________________________________  Quita Skye. Hart Rochester, M.D.   CH/MEDQ  D:  03/26/2008  T:  03/26/2008  Job:  [id number]

## 2010-10-31 NOTE — Op Note (Signed)
NAMEDAO, MEARNS             ACCOUNT NO.:  [id number]   MEDICAL RECORD NO.:  [id number]          PATIENT TYPE:  AMB   LOCATION:  SDS                          FACILITY:  [facility]   PHYSICIAN:  Currie Paris, M.D.DATE OF BIRTH:  1923/11/04   DATE OF PROCEDURE:  01/13/2007  DATE OF DISCHARGE:                               OPERATIVE REPORT   PREOPERATIVE DIAGNOSIS:  Epidermoid cyst, left posterior cervical area.   POSTOPERATIVE DIAGNOSIS:  Epidermoid cyst, left posterior cervical area.   OPERATION:  Excision.   SURGEON:  Currie Paris, M.D.   ANESTHESIA:  Local.   CLINICAL HISTORY:  Miss Clutter is an 78 year old lady with a small cystic  mass of the left posterior neck, which is bothersome to her.   DESCRIPTION OF PROCEDURE:  The patient was seen in the minor procedure  room and we confirmed the planned procedure and the location and marked  the location.  The area was then prepped with some Betadine and  anesthetized with 1% Xylocaine with epi.  I used a total of about 5 mL  of a 1:100,000 mixture.   I waited a few more minutes, and then prepped again with Betadine.  I  made an elliptical incision to take the punctum off and sharply excise  the subcutaneous mass, which appeared to be an epidermoid cyst, well  circumscribed.   There was no bleeding.  The incision was closed with a 5-0 Vicryl  subcuticular and Dermabond, and the patient tolerated the procedure  well.      Currie Paris, M.D.  Electronically Signed     CJS/MEDQ  D:  01/13/2007  T:  01/13/2007  Job:  [id number]   cc:   Currie Paris, MD  [facility]. Danella Deis, M.D.

## 2010-11-03 NOTE — Op Note (Signed)
[name], [name]             ACCOUNT NO.:  [id number]   MEDICAL RECORD NO.:  [id number]          PATIENT TYPE:  AMB   LOCATION:  DAY                          FACILITY:  [facility]   PHYSICIAN:  Currie Paris, M.D.DATE OF BIRTH:  Sep 15, 1923   DATE OF PROCEDURE:  10/01/2005  DATE OF DISCHARGE:                                 OPERATIVE REPORT   CCS [phone]   PREOPERATIVE DIAGNOSIS:  Chronic calculous cholecystitis.   POSTOPERATIVE DIAGNOSIS:  Chronic calculous cholecystitis.   OPERATION:  Laparoscopic cholecystectomy with operative cholangiogram.   SURGEON:  Dr. Jamey Ripa.   ASSISTANT:  Dr. Zachery Dakins.   ANESTHESIA:  General endotracheal.   CLINICAL HISTORY:  This is a 77 year old lady with biliary type symptoms and  a small contracted gallbladder filled with stones on ultrasound.   DESCRIPTION OF PROCEDURE:  The patient was seen in the holding area and had  no further questions.  We confirmed that the laparoscopic cholecystectomy  was the planned procedure.   The patient was taken to the operating room and after satisfactory general  endotracheal anesthesia obtained the abdomen was prepped and draped.  The  time-out occurred.   0.25% plain Marcaine was used for each incision.  The umbilical incision was  made, the fascia opened and the peritoneal cavity entered under direct  vision.  The pursestring was placed, the Hasson introduced and the abdomen  insufflated 15.   The patient placed reverse Trendelenburg tilted to the left.  Under direct  vision a 10/11 trocar was placed the epigastrium and two 5 mm laterally.   There we no specific abnormalities noted in the abdomen.  There were  multiple omental adhesions to the gallbladder and the gallbladder appeared  to contracted around some stones.   The gallbladder was elevated and the omental adhesions taken down sharply  and using some clips as they appeared to be fairly vascular.  I was then  able to dissect near the  triangle of Calot.  The gallbladder was difficult  to grasp because there were stones impacted in the ampulla but I was able to  get enough traction to dissect out a window in the triangle of Calot and  identify the cystic artery and cystic duct.  The duct was fairly short but I  was able to identify its junction with the gallbladder and its apparent  junction with the common duct.   A single clip was placed on the cystic artery and the cystic duct at its  junction with the gallbladder.  The duct was opened and a Cook catheter  introduced and operative cholangiography done.   Cholangiogram appeared to be normal with good filling of a fairly plump  common duct, good filling of the duodenum, no filling defects and normal  anatomy.   Cystic duct catheter was removed and three clips placed on it and it was  divided.  The cystic artery was likewise clipped and divided.  A small  bleeding point at the base the artery was clipped and cauterized and a  little Surgicel left.  This appeared to be dry.   The gallbladder is  removed from below to above.  It almost had to be carved  out of its bed since it was fairly densely adherent and there was a very  poor plane of dissection since the gallbladder was so shrunken and  contracted.  Eventually we got it disconnecting and placed it in a bag.  I  checked the bed of the gallbladder and made sure everything was dry.  There  is a little on the lateral side which had  some Surgicel placed on it once we had it dry with cautery.  The gallbladder  was then brought out the umbilical port.  I had to enlarge that site to get  it out.  We then replaced the cannula.  A final irrigation was made.  I went  back and looked at the bed of the gallbladder and at the area around the  cystic artery, both appeared to be dry with the Surgicel not having any  evidence of any additional bleeding.   The lateral ports removed under direct vision.  There is no bleeding.   The  umbilical port was closed with the pursestring plus the figure-of-eight of 0  Vicryl.  The camera was used for final inspection through the epigastric  port and then removed.  The abdomen was deflated through the epigastric  port.   Skin was closed with 4-0 Monocryl subcuticular and Dermabond.   The patient tolerated procedure well.  No operative complications.  All  counts were correct.      Currie Paris, M.D.  Electronically Signed     CJS/MEDQ  D:  10/01/2005  T:  10/01/2005  Job:  [id number]   cc:   Lenon Curt. Chilton Si, M.D.  Fax: [phone]

## 2011-03-12 LAB — CBC
HCT: 33.9 — ABNORMAL LOW
Hemoglobin: 11.8 — ABNORMAL LOW
MCV: 96.7
Platelets: 291
RBC: 3.5 — ABNORMAL LOW
WBC: 7.8

## 2011-03-12 LAB — CROSSMATCH

## 2011-03-12 LAB — ABO/RH: ABO/RH(D): A POS

## 2011-03-12 LAB — BASIC METABOLIC PANEL
BUN: 18
Calcium: 7.3 — ABNORMAL LOW
Chloride: 96
GFR calc Af Amer: >60
GFR calc Af Amer: >60
GFR calc non Af Amer: >60
GFR calc non Af Amer: >60
Glucose, Bld: 118 — ABNORMAL HIGH
Potassium: 3.1 — ABNORMAL LOW
Potassium: 3.5
Sodium: 130 — ABNORMAL LOW
Sodium: 131 — ABNORMAL LOW

## 2011-03-12 LAB — HEMOGLOBIN AND HEMATOCRIT, BLOOD
Hemoglobin: 6.9 — CL
Hemoglobin: 9.1 — ABNORMAL LOW
Hemoglobin: 9.5 — ABNORMAL LOW

## 2011-03-12 LAB — PROTIME-INR
INR: 1.3
INR: 2 — ABNORMAL HIGH
Prothrombin Time: 15.1
Prothrombin Time: 15.3 — ABNORMAL HIGH
Prothrombin Time: 16.4 — ABNORMAL HIGH
Prothrombin Time: 16.6 — ABNORMAL HIGH
Prothrombin Time: 23 — ABNORMAL HIGH

## 2011-03-12 LAB — URINE MICROSCOPIC-ADD ON

## 2011-03-12 LAB — URINALYSIS, ROUTINE W REFLEX MICROSCOPIC
Glucose, UA: >1000 — AB
Hgb urine dipstick: NEGATIVE
Ketones, ur: NEGATIVE
Leukocytes, UA: NEGATIVE
pH: 6

## 2011-03-12 LAB — DIFFERENTIAL
Eosinophils Relative: 1
Lymphocytes Relative: 17
Lymphs Abs: 1.3
Monocytes Relative: 9

## 2011-06-19 DIAGNOSIS — L989 Disorder of the skin and subcutaneous tissue, unspecified: Secondary | ICD-10-CM

## 2011-06-19 DIAGNOSIS — R634 Abnormal weight loss: Secondary | ICD-10-CM

## 2011-06-19 DIAGNOSIS — F0280 Dementia in other diseases classified elsewhere without behavioral disturbance: Secondary | ICD-10-CM

## 2011-06-19 HISTORY — DX: Dementia in other diseases classified elsewhere, unspecified severity, without behavioral disturbance, psychotic disturbance, mood disturbance, and anxiety: F02.80

## 2011-06-19 HISTORY — DX: Abnormal weight loss: R63.4

## 2011-06-19 HISTORY — DX: Disorder of the skin and subcutaneous tissue, unspecified: L98.9

## 2011-11-29 ENCOUNTER — Encounter: Payer: Self-pay | Admitting: *Deleted

## 2012-09-18 ENCOUNTER — Non-Acute Institutional Stay (SKILLED_NURSING_FACILITY): Payer: Medicare Other | Admitting: Geriatric Medicine

## 2012-09-18 DIAGNOSIS — R4182 Altered mental status, unspecified: Secondary | ICD-10-CM

## 2012-09-18 DIAGNOSIS — Z66 Do not resuscitate: Secondary | ICD-10-CM

## 2012-09-18 DIAGNOSIS — M545 Low back pain: Secondary | ICD-10-CM

## 2012-09-18 DIAGNOSIS — I10 Essential (primary) hypertension: Secondary | ICD-10-CM

## 2012-09-18 DIAGNOSIS — M533 Sacrococcygeal disorders, not elsewhere classified: Secondary | ICD-10-CM

## 2012-09-18 NOTE — Progress Notes (Signed)
Patient ID: [name], female   DOB: 09/26/1923, 79 y.o.   MRN: [id number] Chief Complaint  Patient presents with  . Back Pain  . Decreased PO intake   HPI  This 79 year old female resident of [facility], Assisted Living section, was transferred to the rehabilitation section on April 1 due to complaints of increased back pain, decreased p.o. intake and increased confusion. Patient was evaluated last week by Dr.Hawkes due to her back pain and was prescribed p.r.n. tramadol. She did receive multiple doses of this medication for several days. She is complaining of significantly less back pain. Patient have a fall last evening. Her p.o. intake is doing better sinceRehab admission, she does remain with altered mental status with increased distraction and decreased awareness of her surroundings. Laboratory studies and chest x-ray are negative for signs of obvious infection.  Allergies Reviewed   Medications Reviewed   Data Reviewed    [facility]  09/10/12: Bil. SI joints: No acute fx or subluxation. Mild degenerative changes present Lumbar spine: Mild levoscoliosis. Multilevel degenerative changes   [facility] 09/18/2012 CXR: No cardiomegaly, pulmonary vascular congestion or pleural effusion. No pneumonitis or suspicious nodule   WUJ:[id number], external 09/18/12: WBC 8.6, Hgb 12.5, Hct 37.2, Plt 236  Glu 107, BUN 25, Cr1.32, Na 130, K+ 4.5   Other:  PMH  Past Medical History  Diagnosis Date  . Hypertension   . Hypothyroidism   . Allergic rhinitis due to pollen   . Unspecified constipation   . Stenosis of rectum and anus   . H/O cholelithiasis     post cholecystectomy 10/2005  . Urinary tract infection, site not specified   . Bunion   . Osteoporosis, unspecified   . Other symptoms involving cardiovascular system   . Closed fracture of intertrochanteric section of femur   . Alzheimer's disease 2013  . Anterior subcapsular polar senile  cataract 2006  . Symptomatic cholelithiasis 10/2005    s/p laparoscopic assissted cholecystectomy  . Closed fracture of intertrochanteric section of femur 2009    s/p ORIF w/ IM rod  . Unspecified constipation 2006  . Abnormality of gait 2012  . External hemorrhoids without mention of complication 2011  . Unspecified essential hypertension 2006  . Unspecified hypothyroidism 2006  . Urinary incontinence   . Macular degeneration (senile) of retina, unspecified 09/2010  . Senile osteoporosis 2007  . Other hammer toe (acquired) 2006  . Supraventricular premature beats 2009  . Rheumatoid arthritis 2011  . Anal fissure 2012  . Allergic rhinitis due to pollen 2007  . Sciatica 2010  . Stenosis of rectum and anus 2006  . Unspecified disorder of skin and subcutaneous tissue 2013    keratotic horn left hand  . Pathologic fracture of vertebrae 2010  . Loss of weight 2013  . Back pain, lumbosacral 09/27/2012  . Altered mental state 09/27/2012    PSH  Past Surgical History  Procedure Laterality Date  . Hammer toe surgery  1982  . Cholecystectomy, laparoscopic  2007  . Orif hip fracture Left 08/2007    IM rod  . Wrist arthrotomy Right 05/2009    FH  Family History  Problem Relation Age of Onset  . Heart attack Father   . Leukemia Mother   . Coronary artery disease Father   . Hypertension Father   . Heart attack Daughter 18   SH  History   Social History Narrative   Widow, husband was in the [employer], retired to  [place]. Patient currently resides in Assisted Living section at [facility] since 2012. Previously lived in a single level home in same community since 2006. Does not drive, has a private caregiver during the day 5 days a week. Pet cat, [place].   ROS   DATA OBTAINED: from patient, nurse, medical record, caregiver GENERAL:  No fevers. Decreased activity status, appetite  SKIN: No itch, rash or open wounds EYES: No eye pain, dryness or  itching. No change in vision EARS: No earache, tinnitus, change in hearing NOSE: No congestion, drainage or bleeding MOUTH/THROAT: No mouth or tooth pain. No difficulty chewing or swallowing. RESPIRATORY: No cough, wheezing, SOB CARDIAC: No chest pain, palpitations. No edema. GI: No abdominal pain. No N/V/D or constipation. No heartburn or reflux. Last BM last evening GU: No dysuria, Increased UI MUSCULOSKELETAL:  Back pain, see HPI.  Gait is unsteady, fall last evening.   NEUROLOGIC: No dizziness, fainting, headache. AMS - see HPI.  PSYCHIATRIC: No anxiety, depression, behavior issue. Sleeps well.    PHYSICAL EXAM:  Filed Vitals:   09/18/12 1401  BP: 169/72  Pulse: 77  Temp: 96.8 F (36 C)  Resp: 14   GENERAL APPEARANCE: No acute distress, appropriately groomed, normal body habitus. Alert, pleasant, minimally conversant. HEAD: Normocephalic, atraumatic EYES: Conjunctiva/lids clear. Pupils round, reactive.    MOUTH/THROAT: Lips w/o lesions. Oral mucosa, tongue moist, w/o lesion. Oropharynx w/o redness or lesions.  NECK: Supple, full ROM. No thyroid tenderness, enlargement or nodule LYMPHATICS: No head, neck or supraclavicular adenopathy RESPIRATORY: Breathing is even, unlabored. Lung sounds decreased bilaterally CARDIOVASCULAR: Heart RRR. No murmur or extra heart sounds   EDEMA: No peripheral edema. GASTROINTESTINAL: Abdomen is soft, non-tender, not distended w/ normal bowel sounds.  MUSCULOSKELETAL: Moves all extremities with full ROM, strength and tone. Back is with mild kyphosis, scoliosis. No spinal process tenderness.   NEUROLOGIC: Not oriented to time, place, Cranial nerves 2-12 grossly intact, speech clear, no tremor.    PSYCHIATRIC: Distracted.  ASSESSMENT/PLAN  ESSENTIAL HYPERTENSION 09/17/12: Patient with elevated blood pressure readings since rehabilitation admission. Has been receiving p.r.n. doses of clonidine. Will increase scheduled dose of amlodipine and continue to  monitor carefully.  Altered mental state 09/17/12: Recent increased confusion, decreased p.o. intake. This morning distracted with decreased awareness, voice very soft. Patient has had some decline in cognition due to Alzheimer's dementia in the last several months however this is more abrupt, may be related to tramadol. Stop tramadol.  Back pain, lumbosacral Patient with recent complaints of lower back pain. She saw Dr. Lendon Colonel last week did not think this was related to her RA; Rx p.r.n. dosing of tramadol. Today patient has little complaint of back pain. Stop tramadol (probable AMS). Try Voltaren gel on lower back. Patient is scheduled to see orthopedic surgeon this week   Follow up:  As needed  Travez Stancil T.Niyam Bisping, NP-C 09/18/2012

## 2012-09-22 ENCOUNTER — Non-Acute Institutional Stay (SKILLED_NURSING_FACILITY): Payer: Medicare Other | Admitting: Geriatric Medicine

## 2012-09-22 DIAGNOSIS — R4182 Altered mental status, unspecified: Secondary | ICD-10-CM

## 2012-09-22 DIAGNOSIS — M545 Low back pain: Secondary | ICD-10-CM

## 2012-09-22 DIAGNOSIS — I10 Essential (primary) hypertension: Secondary | ICD-10-CM

## 2012-09-22 DIAGNOSIS — M533 Sacrococcygeal disorders, not elsewhere classified: Secondary | ICD-10-CM

## 2012-09-22 DIAGNOSIS — F0280 Dementia in other diseases classified elsewhere without behavioral disturbance: Secondary | ICD-10-CM

## 2012-09-22 NOTE — Progress Notes (Signed)
Patient ID: [name], female   DOB: May 24, 1924, 79 y.o.   MRN: [id number] Chief Complaint  Patient presents with  . F/U back pain, AMS, BP    HPI Patient reports she is feeling better, staff confirms this. She is more alert though remains disoriented to place and time. She is eating better, complaining not complaining of back pain today. Her activity level is increasing; she is participating in ADLs and ambulating short distances. Gait is reportedly more steady today  Allergies Reviewed   Medications Reviewed    Review of Systems  REVIEW of SYSTEMS:   DATA OBTAINED: from patient, nurse, medical record, caregiver,  GENERAL: Feels better, improved activity status, improved appetite  MOUTH/THROAT: No mouth or tooth pain. No difficulty chewing or swallowing. RESPIRATORY: No cough, wheezing, SOB CARDIAC: No chest pain, palpitations. No edema. GI: No abdominal pain. No N/V/D or constipation. No heartburn or reflux.  GU: No dysuria, frequency or urgency.  No change in urine volume or character. Nighttime incontinence MUSCULOSKELETAL: No joint pain, swelling or stiffness. No back pain.  Gait is more steady.  NEUROLOGIC: No dizziness, fainting, headache . Improved mental status.  PSYCHIATRIC: No anxiety, depression, behavior issue. Sleeps well.   Physical Exam  Filed Vitals:   09/22/12 1800  BP: 167/71  Pulse: 81  Temp: 98.2 F (36.8 C)  Resp: 20   Filed Weights   09/22/12 1800  Weight: 102 lb (46.267 kg)   GENERAL APPEARANCE: No acute distress, appropriately groomed, normal body habitus. Alert, pleasant, conversant. HEAD: Normocephalic, atraumatic EYES: Conjunctiva/lids clear. Pupils round, reactive.   NOSE: No deformity or discharge. MOUTH/THROAT: Lips w/o lesions. Oral mucosa, tongue moist, w/o lesion. Oropharynx w/o redness or lesions.  NECK: Supple, full ROM. No thyroid tenderness, enlargement or nodule LYMPHATICS: No head, neck or supraclavicular  adenopathy RESPIRATORY: Breathing is even, unlabored. Lung sounds are clear and full.  CARDIOVASCULAR: Heart RRR. No murmur or extra heart sounds  EDEMA: No peripheral or periorbital edema. No ascites GASTROINTESTINAL: Abdomen is soft, non-tender, not distended w/ normal bowel sounds.  MUSCULOSKELETAL: Moves all extremities with full ROM, strength and tone.  NEUROLOGIC: Oriented to  Person only. ,Speech clear, no tremor. Marland Kitchen PSYCHIATRIC: Mood and affect appropriate to situation  ASSESSMENT/PLAN  ESSENTIAL HYPERTENSION Blood pressure is improved, range 154-166/71-78; no p.r.n. clonidine required last several days. No further medication changes at this time  Back pain, lumbosacral Patient denies back pain today, nursing staff reports increased activity; patient has done some walking today. Patient is scheduled for orthopedic evaluation tomorrow  Altered mental state Patient is more alert, more interactive. Remains significantly confused. Is close to her baseline functional status. Anticipate discharge to her assisted-living apartment later this week if she continues to improve  Alzheimer's disease Primary caregiver and nursing staff on assisted living report progressive functional decline over the last few months. This is evident in areas of self-care as well as urinary increased urinary incontinence. Care planning with the patient's family to be scheduled soon to discuss appropriate level of care.    Follow up:  As needed  [name] T.[name] Athens, NP-C 09/22/2012

## 2012-09-27 ENCOUNTER — Encounter: Payer: Self-pay | Admitting: Geriatric Medicine

## 2012-09-27 DIAGNOSIS — M069 Rheumatoid arthritis, unspecified: Secondary | ICD-10-CM | POA: Insufficient documentation

## 2012-09-27 DIAGNOSIS — M545 Low back pain, unspecified: Secondary | ICD-10-CM

## 2012-09-27 DIAGNOSIS — R4182 Altered mental status, unspecified: Secondary | ICD-10-CM

## 2012-09-27 DIAGNOSIS — F0280 Dementia in other diseases classified elsewhere without behavioral disturbance: Secondary | ICD-10-CM | POA: Insufficient documentation

## 2012-09-27 DIAGNOSIS — R269 Unspecified abnormalities of gait and mobility: Secondary | ICD-10-CM | POA: Insufficient documentation

## 2012-09-27 DIAGNOSIS — K5900 Constipation, unspecified: Secondary | ICD-10-CM | POA: Insufficient documentation

## 2012-09-27 DIAGNOSIS — R32 Unspecified urinary incontinence: Secondary | ICD-10-CM | POA: Insufficient documentation

## 2012-09-27 HISTORY — DX: Altered mental status, unspecified: R41.82

## 2012-09-27 HISTORY — DX: Low back pain, unspecified: M54.50

## 2012-09-27 NOTE — Assessment & Plan Note (Signed)
Primary caregiver and nursing staff on assisted living report progressive functional decline over the last few months. This is evident in areas of self-care as well as urinary increased urinary incontinence. Care planning with the patient's family to be scheduled soon to discuss appropriate level of care.

## 2012-09-27 NOTE — Assessment & Plan Note (Signed)
Patient is more alert, more interactive. Remains significantly confused. Is close to her baseline functional status. Anticipate discharge to her assisted-living apartment later this week if she continues to improve

## 2012-09-27 NOTE — Assessment & Plan Note (Signed)
Patient denies back pain today, nursing staff reports increased activity; patient has done some walking today. Patient is scheduled for orthopedic evaluation tomorrow

## 2012-09-27 NOTE — Assessment & Plan Note (Signed)
09/17/12: Recent increased confusion, decreased p.o. intake. This morning distracted with decreased awareness, voice very soft. Patient has had some decline in cognition due to Alzheimer's dementia in the last several months however this is more abrupt, may be related to tramadol. Stop tramadol.

## 2012-09-27 NOTE — Assessment & Plan Note (Signed)
Blood pressure is improved, range 154-166/71-78; no p.r.n. clonidine required last several days. No further medication changes at this time

## 2012-09-27 NOTE — Assessment & Plan Note (Signed)
09/17/12: Patient with elevated blood pressure readings since rehabilitation admission. Has been receiving p.r.n. doses of clonidine. Will increase scheduled dose of amlodipine and continue to monitor carefully.

## 2012-09-27 NOTE — Assessment & Plan Note (Addendum)
Patient with recent complaints of lower back pain. She saw Dr. Lendon Colonel last week did not think this was related to her RA; Rx p.r.n. dosing of tramadol. Today patient has little complaint of back pain. Stop tramadol (probable AMS). Try Voltaren gel on lower back. Patient is scheduled to see orthopedic surgeon this week

## 2012-11-19 ENCOUNTER — Encounter: Payer: Self-pay | Admitting: Geriatric Medicine

## 2012-11-19 ENCOUNTER — Non-Acute Institutional Stay: Payer: Medicare Other | Admitting: Geriatric Medicine

## 2012-11-19 VITALS — BP 132/72 | HR 72 | Ht 58.5 in | Wt 107.0 lb

## 2012-11-19 DIAGNOSIS — F0280 Dementia in other diseases classified elsewhere without behavioral disturbance: Secondary | ICD-10-CM

## 2012-11-19 DIAGNOSIS — E039 Hypothyroidism, unspecified: Secondary | ICD-10-CM

## 2012-11-19 DIAGNOSIS — J309 Allergic rhinitis, unspecified: Secondary | ICD-10-CM

## 2012-11-19 DIAGNOSIS — J310 Chronic rhinitis: Secondary | ICD-10-CM | POA: Insufficient documentation

## 2012-11-19 DIAGNOSIS — M069 Rheumatoid arthritis, unspecified: Secondary | ICD-10-CM

## 2012-11-19 DIAGNOSIS — G309 Alzheimer's disease, unspecified: Secondary | ICD-10-CM

## 2012-11-19 DIAGNOSIS — I10 Essential (primary) hypertension: Secondary | ICD-10-CM

## 2012-11-19 NOTE — Progress Notes (Signed)
Patient ID: [name], female   DOB: 03-18-1924, 79 y.o.   MRN: [id number] [facility] [phone])  Chief Complaint  Patient presents with  . Annual Exam    Annual exam  . Medical Managment of Chronic Issues    blood pressure, thyroid, Alzheimers    HPI: This is a 79 y.o. female resident of [facility]  Assisted Living section. This patient  has not had a hospitalization or injury in the last year. Patient did have Rehab stay 09/2012 due to back pain, increased confusion and decreased functional status. Back pain felt not to be due to RA, X-ray c/w arthritis. Pt. had poor response to tramadol.  Pain resolved after topical NSAID and hip injection. Functional status returned to baseline. Patient has demonstrated some functional decline due to AD, has private caregiver daily for supervision/companionship. Continues to go out of facility for shopping/ lunch, eats most meals in AL DR. Familly remains involved, visits frequently.  Recent BP readings have been stable.  Most recent TSH WNL, continues Synthroid RA managed by Dr.Hawkes, treated w/ methotrexate. Pt's only c/o today is cough, runny nose, nasal congestion  Bathing: Moderate Assist Bladder Management: occasional Incontinence, Bowel Management: Occasional Incontinence Feeding: Supervision, requires reminders for meals Hygiene and Grooming: Minimal Assist, Toileting / Clothing: Minimal Assist Walk: Supervision, Requires reminder/ encouragement for daily walks Medications: Requires management/ administration  Allergies  Allergies  Allergen Reactions  . Codeine    Medications  Current outpatient prescriptions:acetaminophen (TYLENOL) 500 MG tablet, Take 500 mg by mouth every 6 (six) hours as needed for pain (MAy have prior to walking. Maximum 6 tabs/day)., Disp: , Rfl: ;  amLODipine (NORVASC) 5 MG tablet, Take 5 mg by mouth 2 (two) times daily., Disp: , Rfl: ;  aspirin 81 MG tablet, Take 81  mg by mouth daily., Disp: , Rfl:  benzocaine (HURRICAINE) 20 % GEL, Place 1 application rectally 3 (three) times daily as needed. Apply small amount inside anal opening, Disp: , Rfl: ;  Calcium Carb-Cholecalciferol (CALCIUM 500 +D) 500-400 MG-UNIT TABS, Take 1 each by mouth daily., Disp: , Rfl: ;  cloNIDine (CATAPRES) 0.1 MG tablet, Take 0.1 mg by mouth every 6 (six) hours as needed (Systolic blood pressure > 190)., Disp: , Rfl:  diclofenac sodium (VOLTAREN) 1 % GEL, Apply 2-4 g topically 4 (four) times daily as needed (Apply 4grams to each hip anf lower back. Max.Dose 32/joint/day, 32g/day total)., Disp: , Rfl: ;  folic acid (FOLVITE) 1 MG tablet, Take 1 mg by mouth daily., Disp: , Rfl: ;  levothyroxine (SYNTHROID, LEVOTHROID) 50 MCG tablet, Take 50 mcg by mouth daily before breakfast., Disp: , Rfl:  memantine (NAMENDA) 10 MG tablet, Take 10 mg by mouth 2 (two) times daily., Disp: , Rfl: ;  methotrexate (RHEUMATREX) 2.5 MG tablet, Take 15 mg by mouth once a week. Take on Thursday. Caution:Chemotherapy. Protect from light., Disp: , Rfl: ;  METOPROLOL TARTRATE PO, Take 50 mg by mouth 2 (two) times daily., Disp: , Rfl: ;  Multiple Vitamins-Minerals (CENTRUM SILVER ADULT 50+) TABS, Take 1 tablet by mouth daily., Disp: , Rfl:  Multiple Vitamins-Minerals (ICAPS) TABS, Take 1 tablet by mouth daily., Disp: , Rfl: ;  polyethylene glycol (MIRALAX / GLYCOLAX) packet, Take 17 g by mouth daily. Mix in 6 ounces beverage of choice. Hold for loose stool, Disp: , Rfl: ;  potassium chloride SA (K-DUR,KLOR-CON) 20 MEQ tablet, Take 20 mEq by mouth 2 (two) times daily., Disp: , Rfl: ;  prednisoLONE 5 MG TABS, Take 5 mg by mouth daily., Disp: , Rfl:  sodium chloride (OCEAN) 0.65 % nasal spray, Place 2 sprays into the nose as needed for congestion (Up to 4x/day. May self administer, keep at bedside)., Disp: , Rfl:    Data Reviewed   Procedure list from [facility] 2004 - Colonoscopy 07/2005 - Abd U/S: cholelithiasis 09/2005 - CXR:  NAD. cholelithiasis. old vertebral fx 12/2005 - Bone Density Scan: hip T-score -2.7 9osteoporosis)  Mammogram  01/2008 - Bone density scan: left radius -4.4, repeat 1 year to assess Forteo therapy 03/2008 - EKG: frequent PACs -carotid doppler study:  60-79% stenosis proximal -mid right ICA. 1-39% stenosis left ICA. - Holter monitor study: basic rhythm sinus with frequent PACs. Longest run 6 beats at 137bpm. 10/19/08 - L-S x-ray: vertebral body compression fracture L1 w/ 70% decrease in bone height. 12 /21/10: Rt. Shoulder X-ray: mild widening of A-C joint. No fracture or dislocation. No significant degenerative changes 10/26/09: EKG: SR w. PACs. 65BPM. Essentially unchanged since 07/2008 01/23/10: MRI brain: No acute infarct. There is cervical spondylosis. MRA head: Mild intracranial atherosclerosis Carotid Doppler: Bo significant stenosis bilaterally. RT. ICA 40-59%. 01/24/10: Transthoracic echocardiogram: Systolic function mildly decreased at 45-5-%. No wall motion abnormalities.    Radiology  [facility]  09/10/12: Bil. SI joints: No acute fx or subluxation. Mild degenerative changes present   Lumbar spine: Mild levoscoliosis. Multilevel degenerative changes   [facility]-ray 09/18/2012  CXR: No cardiomegaly, pulmonary vascular congestion or pleural effusion. No pneumonitis or suspicious nodule   NGE:[id number], external  02/19/2012 CBC: Wbc 8.6, Rbc 3.43, Hgb 11.1, Hct 32.6 CMP: Sodium 140, Potassium 2.8, glucose 87, BUN 20, Creatinine 1.00,   T Protein 5.7, Albumin 3.3 TSH 1.244 Vitamin B12 Vitamin B12 613  09/18/12: WBC 8.6, Hgb 12.5, Hct 37.2, Plt 236   Glu 107, BUN 25, Cr1.32, Na 130, K+ 4.5   Past Medical History  Diagnosis Date  . Hypertension   . Hypothyroidism   . Allergic rhinitis due to pollen   . Unspecified constipation   . Stenosis of rectum and anus   . H/O cholelithiasis     post cholecystectomy 10/2005  . Urinary tract infection, site not specified   .  Bunion   . Osteoporosis, unspecified   . Other symptoms involving cardiovascular system   . Closed fracture of intertrochanteric section of femur   . Alzheimer's disease 2013  . Anterior subcapsular polar senile cataract 2006  . Symptomatic cholelithiasis 10/2005    s/p laparoscopic assissted cholecystectomy  . Closed fracture of intertrochanteric section of femur 2009    s/p ORIF w/ IM rod  . Unspecified constipation 2006  . Abnormality of gait 2012  . External hemorrhoids without mention of complication 2011  . Unspecified essential hypertension 2006  . Unspecified hypothyroidism 2006  . Urinary incontinence   . Macular degeneration (senile) of retina, unspecified 09/2010  . Senile osteoporosis 2007  . Other hammer toe (acquired) 2006  . Supraventricular premature beats 2009  . Rheumatoid arthritis(714.0) 2011  . Anal fissure 2012  . Allergic rhinitis due to pollen 2007  . Sciatica 2010  . Stenosis of rectum and anus 2006  . Unspecified disorder of skin and subcutaneous tissue 2013    keratotic horn left hand  . Pathologic fracture of vertebrae 2010  . Loss of weight 2013  . Back pain, lumbosacral 09/27/2012  . Altered mental state 09/27/2012   Past Surgical History  Procedure Laterality Date  .  Hammer toe surgery  1982  . Cholecystectomy, laparoscopic  2007  . Orif hip fracture Left 08/2007    IM rod  . Wrist arthrotomy Right 05/2009   Family History  Problem Relation Age of Onset  . Heart attack Father   . Leukemia Mother   . Coronary artery disease Father   . Hypertension Father   . Heart attack Daughter 63   History   Social History Narrative   Widow, husband was in the [employer], retired to [employer]. Patient currently resides in Assisted Living section at [facility] since 2012. Previously lived in a single level home in same community since 2006. Does not drive, has a private caregiver during the day 5 days a week. Pet cat, [place].    Review of Systems  DATA OBTAINED: from patient, medical record, caregiver GENERAL: Feels well   No fevers, fatigue, change in appetite or weight SKIN: No itch, rash or open wounds EYES: No eye pain, dryness or itching  No change in vision EARS: No earache, tinnitus, change in hearing NOSE:  Congestion, drainage  No bleeding MOUTH/THROAT: No mouth or tooth pain  No sore throat No difficulty chewing or swallowing RESPIRATORY: Cough, wheezing, SOB CARDIAC: No chest pain, palpitations  No edema. CHEST/BREASTS: No discomfort, discharge or lumps in breasts GI: No abdominal pain  No N/V/D or constipation  No heartburn or reflux  Occ.incontinence GU: No dysuria, frequency or urgency  No change in urine volume or character Occ.incontinence   MUSCULOSKELETAL: No joint pain, swelling or stiffness  No back pain  No muscle ache, pain, weakness  Gait is steady  No recent falls.  NEUROLOGIC: No dizziness, fainting, headache No change in mental status.  PSYCHIATRIC: No feelings of anxiety, depression Sleeps well.  No behavior issue.   Physical Exam Filed Vitals:   11/19/12 1003  BP: 132/72  Pulse: 72  Height: 4' 10.5" (1.486 m)  Weight: 107 lb (48.535 kg)   Body mass index is 21.98 kg/(m^2).  GENERAL APPEARANCE: No acute distress, appropriately groomed, normal body habitus. Alert, pleasant, conversant. HEAD: Normocephalic, atraumatic EYES: Conjunctiva/lids clear. Pupils round, reactive. EOMs intact.  EARS: External exam WNL, canals clear, TM WNL. Poor Hearing. NOSE: No deformity or discharge. MOUTH/THROAT: Lips w/o lesions. Oral mucosa, tongue moist, w/o lesion. Oropharynx w/o redness or lesions.  NECK: Supple, full ROM. No thyroid tenderness, enlargement or nodule LYMPHATICS: No head, neck or supraclavicular adenopathy RESPIRATORY: Breathing is even, unlabored. Lung sounds are clear and full.  CARDIOVASCULAR: Heart RRR. No murmur or extra heart sounds  ARTERIAL: No carotid or femoral bruit.    VENOUS: No varicosities. No venous stasis skin changes  EDEMA: No peripheral  edema.  GASTROINTESTINAL: Abdomen is soft, non-tender, not distended w/ normal bowel sounds. No hepatic or splenic enlargement. No mass, ventral or inguinal hernia. GENITOURINARY: Bladder non tender, not distended. MUSCULOSKELETAL: Moves all extremities with full ROM, strength and tone. Back with kyphosis, scoliosis No spinal process tenderness. Gait is steady w/ walker NEUROLOGIC: Not oriented to time, Oriented toplace, person. Cranial nerves 2-12 grossly intact, speech clear, no tremor.  PSYCHIATRIC: Mood and affect appropriate to situation  ASSESSMENT/PLAN  UNSPECIFIED HYPOTHYROIDISM Asymptomatic, continue Synthroid. Monitor TSH at intervals  Rheumatoid arthritis(714.0) Follow with Dr.Hawkes as scheduled  ESSENTIAL HYPERTENSION BP readings satisfactory, no LE edema with increased dose of amlodipine. Continue weekly monitoring and current medications.   Alzheimer's disease Functional status stable, though declines significantly with any acute illness. Remains appropriate for AL setting augmented  with current caregivers. Continue medication  Allergic rhinitis Recurrent c/o cough, nasal symptoms c/w rhinitis - resume antihistamine    Follow up: 3 months  Altair Appenzeller T.Sigrid Schwebach, NP-C 11/19/2012

## 2012-11-29 NOTE — Assessment & Plan Note (Signed)
Functional status stable, though declines significantly with any acute illness. Remains appropriate for AL setting augmented with current caregivers. Continue medication

## 2012-11-29 NOTE — Assessment & Plan Note (Signed)
Asymptomatic, continue Synthroid. Monitor TSH at intervals

## 2012-11-29 NOTE — Assessment & Plan Note (Signed)
Recurrent c/o cough, nasal symptoms c/w rhinitis - resume antihistamine

## 2012-11-29 NOTE — Assessment & Plan Note (Addendum)
BP readings satisfactory, no LE edema with increased dose of amlodipine. Continue weekly monitoring and current medications.

## 2012-11-29 NOTE — Assessment & Plan Note (Signed)
Follow with Dr.Hawkes as scheduled

## 2012-12-30 DIAGNOSIS — R1312 Dysphagia, oropharyngeal phase: Secondary | ICD-10-CM | POA: Insufficient documentation

## 2013-01-08 ENCOUNTER — Other Ambulatory Visit (HOSPITAL_COMMUNITY): Payer: Self-pay | Admitting: Internal Medicine

## 2013-01-08 DIAGNOSIS — R1310 Dysphagia, unspecified: Secondary | ICD-10-CM

## 2013-01-15 ENCOUNTER — Ambulatory Visit (HOSPITAL_COMMUNITY)
Admission: RE | Admit: 2013-01-15 | Discharge: 2013-01-15 | Disposition: A | Discharge status: Home / Self Care | Payer: Medicare Other | Source: Ambulatory Visit | Attending: Internal Medicine | Admitting: Internal Medicine

## 2013-01-15 DIAGNOSIS — R1310 Dysphagia, unspecified: Secondary | ICD-10-CM

## 2013-01-15 NOTE — Procedures (Signed)
Objective Swallowing Evaluation: Modified Barium Swallowing Study  Patient Details  Name: [name] MRN: [id number] Date of Birth: [birth year]  Today's Date: 01/15/2013 Time: 4132-4401 SLP Time Calculation (min): 59 min  Past Medical History:  Past Medical History  Diagnosis Date  . Hypertension   . Hypothyroidism   . Allergic rhinitis due to pollen   . Unspecified constipation   . Stenosis of rectum and anus   . H/O cholelithiasis     post cholecystectomy 10/2005  . Urinary tract infection, site not specified   . Bunion   . Osteoporosis, unspecified   . Other symptoms involving cardiovascular system   . Closed fracture of intertrochanteric section of femur   . Alzheimer's disease 2013  . Anterior subcapsular polar senile cataract 2006  . Symptomatic cholelithiasis 10/2005    s/p laparoscopic assissted cholecystectomy  . Closed fracture of intertrochanteric section of femur 2009    s/p ORIF w/ IM rod  . Unspecified constipation 2006  . Abnormality of gait 2012  . External hemorrhoids without mention of complication 2011  . Unspecified essential hypertension 2006  . Unspecified hypothyroidism 2006  . Urinary incontinence   . Macular degeneration (senile) of retina, unspecified 09/2010  . Senile osteoporosis 2007  . Other hammer toe (acquired) 2006  . Supraventricular premature beats 2009  . Rheumatoid arthritis(714.0) 2011  . Anal fissure 2012  . Allergic rhinitis due to pollen 2007  . Sciatica 2010  . Stenosis of rectum and anus 2006  . Unspecified disorder of skin and subcutaneous tissue 2013    keratotic horn left hand  . Pathologic fracture of vertebrae 2010  . Loss of weight 2013  . Back pain, lumbosacral 09/27/2012  . Altered mental state 09/27/2012   Past Surgical History:  Past Surgical History  Procedure Laterality Date  . Hammer toe surgery  1982  . Cholecystectomy, laparoscopic  2007  . Orif hip fracture Left 08/2007    IM rod  . Wrist arthrotomy  Right 05/2009   HPI:  79 yo female resident of [facility] referred for MBS due to concerns pt may be aspirating.  PMH + for dementia, allergic rhinits, hypothyroidism, RA, femur fax s/p ORIF 2009, UTI, hypertension, weight loss, back pain, and pna (many years ago per family).  Pt reports frequent cough that she states is worse when lying down. Family reports cough is worse with solids during meals.  Pt is kyphotic and completely denies reflux symptoms.    Pt primarily complains of chronic cough and denies significant problems swallowing.         Assessment / Plan / Recommendation Clinical Impression  Dysphagia Diagnosis: Mild oral phase dysphagia;Mild pharyngeal phase dysphagia Clinical impression: Mild oropharyngeal dysphagia present with sensorimotor deficits resulting in oropharyngeal stasis (base of tongue, vallecular/pyriform sinus) WITHOUT pt sensation.  Cued dry swallows helped to decrease stasis (stasis appeared worse with liquids than solids).  No aspiration was observed but intermittent trace laryngeal penetration apparent with thin (even with multiple sequential swallows) that did clear with throat clearing.  Chin tuck posture, head turn right/left did not fully prevent penetration nor decrease stasis.    Of note, pt coughed x3 during MBS without barium visualized in trachea or larynx.  Pt excessively coughed within 5 minutes of testing and this cough was productive to clear viscous secretions.  Reflux Symptom Index given to pt with score of 20/45 indicating possible laryngopharyngeal reflux.  Pt may benefit from testing for possible reflux symptoms.    Rec follow  up with SLP for dysphagia treatment to strengthen pt's tongue base and laryngeal musculature.   SLP educated pt throughout testing and after to heimlich manuever, aspiration precautions/compensation strategies, etc.     Treatment Recommendation  Defer treatment plan to SLP at (Comment) ([facility])    Diet Recommendation  Regular;Thin liquid   Liquid Administration via: Cup;Straw Medication Administration: Whole meds with liquid Supervision: Patient able to self feed Compensations: Slow rate;Small sips/bites;Multiple dry swallows after each bite/sip;Follow solids with liquid;Clear throat intermittently (start meal with liquids) Postural Changes and/or Swallow Maneuvers: Seated upright 90 degrees;Upright 30-60 min after meal    Other  Recommendations Recommended Consults: Other (Comment) (consider reflux work up) Oral Care Recommendations: Oral care QID   Follow Up Recommendations   (SLP at current environment)           SLP Swallow Goals     General Date of Onset: 01/15/13 HPI: 79 yo female resident of [facility] referred for MBS due to concerns pt may be aspirating.  PMH + for dementia, allergic rhinits, hypothyroidism, RA, femur fax s/p ORIF 2009, UTI, hypertension, weight loss, back pain, and pna (many years ago per family).  Pt reports frequent cough that she states is worse when lying down. Family reports cough is worse with solids during meals.  Pt is kyphotic and completely denies reflux symptoms.    Pt primarily complains of chronic cough and denies significant problems swallowing.     Type of Study: Modified Barium Swallowing Study Reason for Referral: Objectively evaluate swallowing function Diet Prior to this Study: Regular;Thin liquids Respiratory Status: Room air Behavior/Cognition: Alert;Cooperative;Pleasant mood Oral Cavity - Dentition: Adequate natural dentition Oral Motor / Sensory Function:  (delayed palatal elevation) Self-Feeding Abilities: Able to feed self Patient Positioning: Upright in chair Baseline Vocal Quality: Clear Volitional Cough: Strong Volitional Swallow: Able to elicit Anatomy: Within functional limits Pharyngeal Secretions: Not observed secondary MBS    Reason for Referral Objectively evaluate swallowing function   Oral Phase Oral Preparation/Oral Phase Oral  Phase: Impaired Oral - Thin Oral - Thin Cup: Weak lingual manipulation;Lingual/palatal residue Oral - Thin Straw: Weak lingual manipulation;Lingual/palatal residue Oral - Solids Oral - Puree: Weak lingual manipulation;Lingual/palatal residue Oral - Regular: Weak lingual manipulation;Lingual/palatal residue Oral - Pill: Weak lingual manipulation;Lingual/palatal residue Oral Phase - Comment Oral Phase - Comment: premature spillage of liquid after swallow without pt awareness, tonguebase with stasis without pt awareness   Pharyngeal Phase Pharyngeal Phase Pharyngeal Phase: Impaired Pharyngeal - Nectar Pharyngeal - Nectar Cup: Pharyngeal residue - valleculae;Pharyngeal residue - pyriform sinuses;Reduced pharyngeal peristalsis;Reduced tongue base retraction;Reduced epiglottic inversion Pharyngeal - Thin Pharyngeal - Thin Cup: Pharyngeal residue - pyriform sinuses;Pharyngeal residue - valleculae;Penetration/Aspiration after swallow;Reduced pharyngeal peristalsis;Reduced tongue base retraction;Reduced epiglottic inversion Penetration/Aspiration details (thin cup): Material enters airway, CONTACTS cords and not ejected out Pharyngeal - Thin Straw: Pharyngeal residue - pyriform sinuses;Pharyngeal residue - valleculae;Penetration/Aspiration after swallow;Reduced pharyngeal peristalsis;Reduced tongue base retraction;Reduced epiglottic inversion Penetration/Aspiration details (thin straw): Material enters airway, remains ABOVE vocal cords and not ejected out Pharyngeal - Solids Pharyngeal - Puree: Pharyngeal residue - pyriform sinuses;Pharyngeal residue - valleculae;Reduced pharyngeal peristalsis;Reduced epiglottic inversion;Reduced tongue base retraction Pharyngeal - Regular: Pharyngeal residue - pyriform sinuses;Pharyngeal residue - valleculae;Reduced tongue base retraction;Reduced epiglottic inversion;Reduced pharyngeal peristalsis Pharyngeal - Pill: Reduced epiglottic inversion;Reduced pharyngeal  peristalsis;Reduced tongue base retraction;Pharyngeal residue - valleculae;Pharyngeal residue - pyriform sinuses (thin residuals) Pharyngeal Phase - Comment Pharyngeal Comment: chin tuck, head turn left/right did not decrease stasis nor prevent trace penetration, cued throat clear removed trace  penetrates  Cervical Esophageal Phase    GO    Cervical Esophageal Phase Cervical Esophageal Phase: Impaired Cervical Esophageal Phase - Comment Cervical Esophageal Comment: reduced UES opening resulted in stasis at pyriform sinus with liquids more than solids WITHOUT pt awareness, pt appeared with generally narrow proximal esophagus, dry swallows help to decrease stasis, esophagus appeared clear upon sweep x4, radiologist not present to confirm findings, pt is kyphotic which may contribute to issues    Functional Assessment Tool Used: mbs clinical judgement Functional Limitations: Swallowing Swallow Current Status ([id number]): At least 20 percent but less than 40 percent impaired, limited or restricted Swallow Goal Status [phone]): At least 20 percent but less than 40 percent impaired, limited or restricted Swallow Discharge Status [phone]): At least 20 percent but less than 40 percent impaired, limited or restricted    Donavan Burnet, MS [facility] SLP [phone]

## 2013-02-17 LAB — TSH: TSH: 2.14 u[IU]/mL (ref 0.41–5.90)

## 2013-03-24 LAB — CBC AND DIFFERENTIAL
HCT: 34 % — AB (ref 36–46)
Platelets: 217 10*3/uL (ref 150–399)

## 2013-03-24 LAB — BASIC METABOLIC PANEL WITH GFR
BUN: 27 mg/dL — AB (ref 4–21)
Creatinine: 1 mg/dL (ref 0.5–1.1)
Glucose: 66 mg/dL
Potassium: 3.6 mmol/L (ref 3.4–5.3)
Sodium: 138 mmol/L (ref 137–147)

## 2013-03-30 ENCOUNTER — Encounter: Payer: Self-pay | Admitting: Internal Medicine

## 2013-03-30 ENCOUNTER — Non-Acute Institutional Stay: Payer: Medicare Other | Admitting: Internal Medicine

## 2013-03-30 VITALS — BP 132/82 | HR 68 | Ht 58.5 in | Wt 115.0 lb

## 2013-03-30 DIAGNOSIS — M069 Rheumatoid arthritis, unspecified: Secondary | ICD-10-CM

## 2013-03-30 DIAGNOSIS — F0280 Dementia in other diseases classified elsewhere without behavioral disturbance: Secondary | ICD-10-CM

## 2013-03-30 DIAGNOSIS — R269 Unspecified abnormalities of gait and mobility: Secondary | ICD-10-CM

## 2013-03-30 DIAGNOSIS — E039 Hypothyroidism, unspecified: Secondary | ICD-10-CM

## 2013-03-30 DIAGNOSIS — M533 Sacrococcygeal disorders, not elsewhere classified: Secondary | ICD-10-CM

## 2013-03-30 DIAGNOSIS — M545 Low back pain, unspecified: Secondary | ICD-10-CM

## 2013-03-30 DIAGNOSIS — I10 Essential (primary) hypertension: Secondary | ICD-10-CM

## 2013-03-30 NOTE — Patient Instructions (Signed)
Continue current medicatoins 

## 2013-03-30 NOTE — Progress Notes (Signed)
Subjective:    Patient ID: [name], female    DOB: [birth year], 79 y.o.   MRN: [id number]  HPI  ESSENTIAL HYPERTENSION: controlled  Unspecified hypothyroidism: controlled  Rheumatoid arthritis(714.0): no recent acute flares  Back pain, lumbosacral: Chronic LS pains. To see Dr. Ethelene Hal 04/03/13 to get epidural injx.  Abnormality of gait: unstable  Alzheimer's disease: unchanged    Current Outpatient Prescriptions on File Prior to Visit  Medication Sig Dispense Refill  . acetaminophen (TYLENOL) 500 MG tablet Take 500 mg by mouth every 6 (six) hours as needed for pain (MAy have prior to walking. Maximum 6 tabs/day).      Marland Kitchen amLODipine (NORVASC) 5 MG tablet Take 5 mg by mouth 2 (two) times daily.      Marland Kitchen aspirin 81 MG tablet Take 81 mg by mouth daily.      . benzocaine (HURRICAINE) 20 % GEL Place 1 application rectally 3 (three) times daily as needed. Apply small amount inside anal opening      . Calcium Carb-Cholecalciferol (CALCIUM 500 +D) 500-400 MG-UNIT TABS Take 1 each by mouth daily.      . cloNIDine (CATAPRES) 0.1 MG tablet Take 0.1 mg by mouth every 6 (six) hours as needed (Systolic blood pressure > 190).      Marland Kitchen diclofenac sodium (VOLTAREN) 1 % GEL Apply 2-4 g topically 4 (four) times daily as needed (Apply 4grams to each hip anf lower back. Max.Dose 32/joint/day, 32g/day total).      . folic acid (FOLVITE) 1 MG tablet Take 1 mg by mouth daily.      Marland Kitchen levothyroxine (SYNTHROID, LEVOTHROID) 50 MCG tablet Take 50 mcg by mouth daily before breakfast.      . memantine (NAMENDA) 10 MG tablet Take 10 mg by mouth 2 (two) times daily.      . methotrexate (RHEUMATREX) 2.5 MG tablet Take 15 mg by mouth once a week. Take on Thursday. Caution:Chemotherapy. Protect from light.      Marland Kitchen METOPROLOL TARTRATE PO Take 50 mg by mouth 2 (two) times daily.      . Multiple Vitamins-Minerals (CENTRUM SILVER ADULT 50+) TABS Take 1 tablet by mouth daily.      . Multiple Vitamins-Minerals (ICAPS) TABS  Take 1 tablet by mouth daily.      . polyethylene glycol (MIRALAX / GLYCOLAX) packet Take 17 g by mouth daily. Mix in 6 ounces beverage of choice. Hold for loose stool      . potassium chloride SA (K-DUR,KLOR-CON) 20 MEQ tablet Take 20 mEq by mouth 2 (two) times daily.      . prednisoLONE 5 MG TABS Take 5 mg by mouth daily.      . sodium chloride (OCEAN) 0.65 % nasal spray Place 2 sprays into the nose as needed for congestion (Up to 4x/day. May self administer, keep at bedside).       No current facility-administered medications on file prior to visit.    Review of Systems  Constitutional: Negative for fever, chills, diaphoresis, activity change, appetite change, fatigue and unexpected weight change.  HENT: Negative.   Eyes: Negative.   Respiratory: Negative for cough, choking, chest tightness, shortness of breath and wheezing.   Cardiovascular: Negative for chest pain, palpitations and leg swelling.  Gastrointestinal:       01/15/13 MBSS: Mild impairment  Endocrine: Negative for cold intolerance, heat intolerance, polydipsia, polyphagia and polyuria.  Genitourinary:       Incontinence of urine  Musculoskeletal:  Unstable on standing. Able to walk with assistance or wallker.  Skin: Negative.   Neurological: Negative for tremors, seizures, syncope and headaches.       Loss of memory  Hematological: Negative.   Psychiatric/Behavioral: Negative.        Objective:BP 132/82  Pulse 68  Ht 4' 10.5" (1.486 m)  Wt 115 lb (52.164 kg)  BMI 23.62 kg/m2    Physical Exam  Constitutional: No distress.  Frail, elderly  HENT:  Head: Normocephalic and atraumatic.  Right Ear: External ear normal.  Left Ear: External ear normal.  Nose: Nose normal.  Mouth/Throat: Oropharynx is clear and moist.  Eyes: Conjunctivae are normal. Pupils are equal, round, and reactive to light.  Neck: No JVD present. No tracheal deviation present. No thyromegaly present.  Cardiovascular: Normal rate, regular  rhythm, normal heart sounds and intact distal pulses.  Exam reveals no gallop and no friction rub.   No murmur heard. Pulmonary/Chest: No respiratory distress. She has no wheezes. She has rales. She exhibits no tenderness.  Abdominal: She exhibits no distension and no mass. There is no tenderness.  Musculoskeletal: Normal range of motion. She exhibits no edema and no tenderness.  Lymphadenopathy:    She has no cervical adenopathy.  Neurological: She displays normal reflexes. No cranial nerve deficit. She exhibits normal muscle tone. Coordination normal.  Memory loss. Not oriented to time.  Skin: No rash noted. No erythema. No pallor.  Psychiatric: She has a normal mood and affect. Her behavior is normal. Judgment and thought content normal.      LAB REVIEW 02/17/13 Hgb 10.7, MCV 96.3  CMP: nl  TSH 2.147 02/24/13 Hgb 10.7 03/24/13 CBC; Hgb 11.6  BMP: nl    Assessment & Plan:  ESSENTIAL HYPERTENSION: controlled  Unspecified hypothyroidism: corrected on supplements  Rheumatoid arthritis(714.0): stable on methotrexate. Followed by Dr. Nickola Major  Back pain, lumbosacral; appt with Dr. Ethelene Hal  Abnormality of gait: continue to use walker or assistance.  Alzheimer's disease: unchanged

## 2013-05-22 ENCOUNTER — Non-Acute Institutional Stay: Payer: Medicare Other | Admitting: Geriatric Medicine

## 2013-05-22 ENCOUNTER — Encounter: Payer: Self-pay | Admitting: Geriatric Medicine

## 2013-05-22 DIAGNOSIS — M479 Spondylosis, unspecified: Secondary | ICD-10-CM

## 2013-05-22 DIAGNOSIS — M47815 Spondylosis without myelopathy or radiculopathy, thoracolumbar region: Secondary | ICD-10-CM

## 2013-05-22 NOTE — Progress Notes (Signed)
Patient ID: [name], female   DOB: 1923/09/10, 79 y.o.   MRN: [id number]  [facility] (13)  Code Status: DNR Contact Information   Name Relation Home Work Mobile   [name] Daughter   [phone]      Chief Complaint  Patient presents with  . Difficulty Walking    HPI: This is a 79 y.o. female resident of [facility] Assisted Living section.  Evaluation is requested today due to difficulty ambulating. I received a phone call from the patient's daughter 2 days ago reporting that patient is having more difficulty ambulating, specifically getting in and out of the car. This patient has known diagnosis of  osteoarthritis of the lower back. Has been reated with topical and oral and NSAIDs as well as injection therapy. Last injection 04/01/2013 was very helpful in reducing pain. Patient has previously responded well to physical therapy interventions. Patient's current pain management is with scheduled Tylenol and Voltaren topical gel.   Allergies  Allergen Reactions  . Codeine    Medications Reviewed  DATA REVIEWED  Radiologic Exams  [facility], [facility]  09/10/12: Bil. SI joints: No acute fx or subluxation. Mild degenerative changes present               Lumbar spine: Mild levoscoliosis. Multilevel degenerative changes    Cardiovascular Exams:   Laboratory Studies  Lab Results- [facility]  Component Value Date   WBC 7.6 03/24/2013   HGB 11.6* 03/24/2013   HCT 34* 03/24/2013   PLT 217 03/24/2013        GLUCOSE 66* 03/24/2013   NA 138 03/24/2013   K 3.6 03/24/2013   CREATININE 1.0 03/24/2013   BUN 27* 03/24/2013      Review of Systems  DATA OBTAINED: from patient, medical record, caregiver, family member GENERAL: Feels well   No feverschange in appetite or weight. Tires easily RESPIRATORY: No cough, wheezing, SOB CARDIAC: No chest pain, palpitations  No edema. GI: No abdominal pain  No N/V/D or constipation  No  heartburn or reflux  GU: No dysuria, frequency or urgency  No change in urine volume or character No nocturia or change in stream   MUSCULOSKELETAL: Left hip/lower back pain, worse first thing in AM. Aterior thigh pain   Gait is unsteady, not new  No recent falls.  NEUROLOGIC: No dizziness, fainting, headache,  No change in mental status (moderate stage AD) PSYCHIATRIC: No feelings of anxiety, depression Sleeps well.  No behavior issue.    Physical Exam Filed Vitals:   05/22/13 1200  BP: 158/62  Pulse: 85  Temp: 97.4 F (36.3 C)  Weight: 115 lb 3.2 oz (52.254 kg)   Body mass index is 23.66 kg/(m^2). GENERAL APPEARANCE: No acute distress, appropriately groomed, normal body habitus. Alert, pleasant, conversant. SKIN: No diaphoresis, rash, unusual lesions, wounds HEAD: Normocephalic, atraumatic EYES: Conjunctiva/lids clear.   RESPIRATORY: Breathing is even, unlabored. Lung sounds are clear and full.  CHEST/BREASTS: No chest deformity. Breasts without tenderness, mass, discharge CARDIOVASCULAR: Heart RRR. No murmur or extra heart sounds  EDEMA: No peripheral edema. MUSCULOSKELETAL: Full range of motion both hips without pain. Ambulates well with walker, rises easily from bedside and chair. Does exhibit poor safety awareness when sitting in chair.  NEUROLOGIC: Not oriented to time, is oriented to place, person. Speech clear, no tremor.  PSYCHIATRIC: Mood and affect appropriate to situation  ASSESSMENT/PLAN  Osteoarthritis of back Degenerative changes in the patient's lower spine and SI joint area contributing to the  discomfort and difficulty walking. Current pain management is adequate, patient may benefit from repeat PT interventions particularly repeat regarding safety and getting in and out of a car.    Follow up: As scheduled in [facility] clinic  Zorawar Strollo T.Lelan Cush, NP-C 05/22/2013

## 2013-05-26 LAB — BASIC METABOLIC PANEL
CREATININE: 1.5 mg/dL — AB (ref 0.5–1.1)
GLUCOSE: 175 mg/dL
POTASSIUM: 3.8 mmol/L (ref 3.4–5.3)

## 2013-05-26 LAB — CBC AND DIFFERENTIAL
HEMATOCRIT: 38 % (ref 36–46)
Hemoglobin: 13 g/dL (ref 12.0–16.0)
Platelets: 225 10*3/uL (ref 150–399)
WBC: 7.8 10^3/mL

## 2013-05-29 DIAGNOSIS — M47815 Spondylosis without myelopathy or radiculopathy, thoracolumbar region: Secondary | ICD-10-CM | POA: Insufficient documentation

## 2013-05-29 NOTE — Assessment & Plan Note (Signed)
Degenerative changes in the patient's lower spine and SI joint area contributing to the discomfort and difficulty walking. Current pain management is adequate, patient may benefit from repeat PT interventions particularly repeat regarding safety and getting in and out of a car.

## 2013-06-17 ENCOUNTER — Non-Acute Institutional Stay: Payer: Medicare Other | Admitting: Geriatric Medicine

## 2013-06-17 ENCOUNTER — Encounter: Payer: Self-pay | Admitting: Geriatric Medicine

## 2013-06-17 VITALS — BP 120/72 | HR 64 | Wt 117.0 lb

## 2013-06-17 DIAGNOSIS — E039 Hypothyroidism, unspecified: Secondary | ICD-10-CM

## 2013-06-17 DIAGNOSIS — F0280 Dementia in other diseases classified elsewhere without behavioral disturbance: Secondary | ICD-10-CM

## 2013-06-17 DIAGNOSIS — I10 Essential (primary) hypertension: Secondary | ICD-10-CM

## 2013-06-17 NOTE — Progress Notes (Signed)
Patient ID: [name], female   DOB: 1924/02/22, 79 y.o.   MRN: [id number]  [facility] [phone])  Code Status: DNR Contact Information   Name Relation Home Work Mobile   [name],[name] Daughter   [phone]      Chief Complaint  Patient presents with  . Medical Managment of Chronic Issues    blood pressure, thyroid, Alzheimers    HPI: This is a 79 y.o. female resident of [facility]isted [facility]illed Nursing section evaluated today for management of ongoing medical issues.    Recent visits: 05/22/2013 Osteoarthritis of back Degenerative changes in the patient's lower spine and SI joint area contributing to the discomfort and difficulty walking. Current pain management is adequate, patient may benefit from repeat PT interventions particularly repeat regarding safety and getting in and out of a car.  UNSPECIFIED HYPOTHYROIDISM Asymptomatic, continue Synthroid. Monitor TSH at intervals  Rheumatoid arthritis(714.0) Follow with Dr.Hawkes as scheduled  ESSENTIAL HYPERTENSION BP readings satisfactory, no LE edema with increased dose of amlodipine. Continue weekly monitoring and current medications.   Alzheimer's disease Functional status stable, though declines significantly with any acute illness. Remains appropriate for AL setting augmented with current caregivers. Continue medication  Allergic rhinitis Recurrent c/o cough, nasal symptoms c/w rhinitis - resume antihistamine   Since last visit, patient was evaluated by physical therapy, no further intervention is required. Patient continues daily exercises with caregiver including ambulating in the hallways. She has been able to get in and out of the car without difficulty. Moves slowly. Patient continues to have a daily caregiver. Her cognitive status is unchanged; continues to require cuing for most activities and limited assistance with ADLs. Review facility record  shows patient's blood pressure readings have been consistently elevated in the range 140-185/7085. Patient has been receiving her prescribed medications as directed. Recent lab satisfactory.  Patient continues thyroid supplement, no signs of hypo-or hyperthyroid. Most recent TSH is satisfactory.   Allergies  Allergen Reactions  . Codeine    Medications Reviewed  DATA REVIEWED  Radiologic Exams  [facility], [facility]  09/10/12: Bil. SI joints: No acute fx or subluxation. Mild degenerative changes present               Lumbar spine: Mild levoscoliosis. Multilevel degenerative changes    Cardiovascular Exams:   Laboratory Studies  Lab Results- [facility]- 03/24/2013  Component Value   WBC 7.6   HGB 11.6*   HCT 34*   PLT 217       GLUCOSE 66*   NA 138   K 3.6   CREATININE 1.0   BUN 27*   Lab Results- [facility] 05/26/2013  Component Value   WBC 7.8   HGB 13.0   HCT 38   PLT 225       GLUCOSE 175   NA 138   K 3.8   CREATININE 1.5*   BUN 27*   GFR  34.8   Albumin 3.8    Review of Systems  DATA OBTAINED: from patient, nurse, medical record, caregiver GENERAL: Feels well   No fevers, fatigue, change in appetite or weight SKIN: No itch, rash or open wounds EYES: No eye pain, dryness or itching  No change in vision EARS: No earache, tinnitus, change in hearing NOSE: No congestion, drainage or bleeding MOUTH/THROAT: No mouth or tooth pain    No sore throat   No difficulty chewing or swallowing RESPIRATORY: No cough, wheezing, SOB CARDIAC: No chest pain, palpitations  No edema. GI: No  abdominal pain  No N/V/D or constipation  No heartburn or reflux  Occasional incontinence GU: No dysuria, frequency or urgency  No change in urine volume or character Occasional incontinence MUSCULOSKELETAL: No joint pain, swelling or stiffness  No back pain  occasional complaint of thigh discomfort,  Gait is unsteady  No recent falls.  NEUROLOGIC: No dizziness, fainting, headache, No change in  mental status (dementia) PSYCHIATRIC: No feelings of anxiety, depression Sleeps well.  No behavior issue.    Physical Exam Filed Vitals:   06/17/13 1112  BP: 120/72  Pulse: 64  Weight: 117 lb (53.071 kg)   Body mass index is 24.03 kg/(m^2).  GENERAL APPEARANCE: No acute distress, appropriately groomed, normal body habitus. Alert, pleasant, conversant. SKIN: No diaphoresis, rash, unusual lesions,   HEAD: Normocephalic, atraumatic EYES: Conjunctiva/lids clear. Pupils round, reactive.  EARS: External exam WNL, diminished Hearing, chronic. NOSE: No deformity or discharge. MOUTH/THROAT: Lips w/o lesions. Oral mucosa, tongue moist, w/o lesion. Oropharynx w/o redness or lesions.  NECK: Supple, full ROM. No thyroid tenderness, enlargement or nodule LYMPHATICS: No head, neck or supraclavicular adenopathy RESPIRATORY: Breathing is even, unlabored. Lung sounds are clear and full.  CARDIOVASCULAR: Heart RRR. No murmur or extra heart sounds  ARTERIAL: No carotid bruit.   EDEMA: No peripheral  GASTROINTESTINAL: Abdomen is soft, non-tender, not distended w/ normal bowel sounds. MUSCULOSKELETAL: Moves all extremities with full ROM, strength and tone. Back with mild kyphosis, scoliosis, No spinal process tenderness. Gait is unsteady, better with walker NEUROLOGIC: Not Oriented to time, Oriented to place, person. Cranial nerves 2-12 grossly intact, speech clear, no tremor.  PSYCHIATRIC: Mood and affect appropriate to situation  ASSESSMENT/PLAN  ESSENTIAL HYPERTENSION Blood pressure range too high, and ACE inhibitor. Continue daily blood pressure checks for the next couple of weeks, follow lab at intervals  Hypothyroidism Unchanged, continue thyroid supplement  Alzheimer's disease Cognition/ functional status remained unchanged, patient in moderate stage of Alzheimer's dementia. Requires assistance and cueing with daily activities; this is provided by a caregiver. With this assistance she remains  appropriate for assisted living setting. Continue medication.    Follow up: As scheduled in [facility]; April 2015 Dr. Chilton Si,  June 2015 CT Daryll Drown, NP-C 06/17/2013

## 2013-06-21 NOTE — Assessment & Plan Note (Signed)
Blood pressure range too high, and ACE inhibitor. Continue daily blood pressure checks for the next couple of weeks, follow lab at intervals

## 2013-06-21 NOTE — Assessment & Plan Note (Signed)
Unchanged, continue thyroid supplement

## 2013-06-21 NOTE — Assessment & Plan Note (Signed)
Cognition/ functional status remained unchanged, patient in moderate stage of Alzheimer's dementia. Requires assistance and cueing with daily activities; this is provided by a caregiver. With this assistance she remains appropriate for assisted living setting. Continue medication.

## 2013-08-18 ENCOUNTER — Non-Acute Institutional Stay: Payer: Medicare Other | Admitting: Geriatric Medicine

## 2013-08-18 ENCOUNTER — Encounter: Payer: Self-pay | Admitting: Geriatric Medicine

## 2013-08-18 DIAGNOSIS — J309 Allergic rhinitis, unspecified: Secondary | ICD-10-CM

## 2013-08-18 NOTE — Assessment & Plan Note (Signed)
Chronic problem for this patient. Antihistamine was changed to Chlor-Trimeton several weeks ago with no improvement. She's experiencing increased clear drainage as well as nasal congestion today. Also having significant postnasal drip drainage causing some nausea and vomiting. We'll add decongestant for several days also change nasal spray 2 combination antihistamine and steroid. Monitor for effectiveness

## 2013-08-18 NOTE — Progress Notes (Signed)
Patient ID: [name], female   DOB: 1923-11-04, 80 y.o.   MRN: [id number]   [facility] [phone])   Contact Information   Name Relation Home Work Mobile   [name],[name] Daughter   [phone]       Chief Complaint  Patient presents with  . Nasal Congestion    HPI: This is a 80 y.o. female resident of [facility], Assisted Living  section.  Evaluation is requested today due to worsening congestion. Patient started left a phone message yesterday with this observation and asked for further evaluation and treatment. Patient has chronic rhinitis and has been treated with antihistamine and nasal steroid spray for quite some time with limited success. Theron Arista reports the patient is having increased nasal drainage, she doesn't blow her nose in stance instead to swallow his secretions. She has had 2 episodes of vomiting yesterday and today.    Allergies  Allergen Reactions  . Codeine     MEDICATIONS -  Reviewed  DATA REVIEWED  Radiologic Exams:   Laboratory Studies:   REVIEW OF SYSTEMS  DATA OBTAINED: from patient, caregiver, family member GENERAL: Feels "just OK"   No recent fever, fatigue. Decreased appetite last few days SKIN: No itch, rash or open wounds EYES: No eye pain, dryness or itching  No change in vision EARS: No earache, tinnitus, change in hearing NOSE: Congestion, drainage present, drainage is clear MOUTH/THROAT: No mouth or tooth pain  No sore throat   No difficulty chewing or swallowing RESPIRATORY: Cough present, No wheezing, SOB CARDIAC: No chest pain, palpitations  No edema. GI: No abdominal pain  Mild nausea, 2 episodes of vomiting in last 2 days. No diarrhea or constipation  No heartburn or reflux  NEUROLOGIC: No dizziness, fainting, headache  No change in mental status (dementia).  PSYCHIATRIC: No feelings of anxiety, depression  Sleeps well.  No behavior issue.    PHYSICAL EXAM Filed Vitals:   08/18/13 1542  BP: 152/74  Pulse: 86  Temp: 97.8 F (36.6 C)  Resp: 19  Weight: 112 lb (50.803 kg)  SpO2: 97%   Body mass index is 23.01 kg/(m^2).  GENERAL APPEARANCE: No acute distress, appropriately groomed, normal body habitus. Alert, pleasant, conversant,Voice with nasal quality. Marland Kitchen SKIN: No diaphoresis, rash, unusual lesions, wounds HEAD: Normocephalic, atraumatic EYES: Conjunctiva/lids clear. Pupils round, reactive.   EARS: External exam WNL, canals clear, diminished Hearing (chronic). NOSE: No deformity or discharge. Clear drainage evident. Frontal and maxillary sinuses nontender MOUTH/THROAT: Lips w/o lesions. Oral mucosa, tongue moist, w/o lesion. Oropharynx w/o redness or lesions. NECK: Supple, full ROM. No thyroid tenderness, enlargement or nodule LYMPHATICS: No head, neck or supraclavicular adenopathy RESPIRATORY: Breathing is even, unlabored. Lung sounds are clear and full.  CARDIOVASCULAR: Heart RRR. No murmur or extra heart sounds NEUROLOGIC: Oriented to person. Seech clear, no tremor.  PSYCHIATRIC: Mood and affect appropriate to situation   ASSESSMENT/PLAN  Allergic rhinitis Chronic problem for this patient. Antihistamine was changed to Chlor-Trimeton several weeks ago with no improvement. She's experiencing increased clear drainage as well as nasal congestion today. Also having significant postnasal drip drainage causing some nausea and vomiting. We'll add decongestant for several days also change nasal spray 2 combination antihistamine and steroid. Monitor for effectiveness    Family/ staff Communication:  Discussed today's findings and plan with the daughter, she agrees   Labs/tests ordered:    Follow up: Return for Routine/as needed.  Toribio Harbour, NP-C [facility] [phone]  08/18/2013

## 2013-08-26 ENCOUNTER — Encounter: Payer: Medicare Other | Admitting: Geriatric Medicine

## 2013-08-27 ENCOUNTER — Encounter: Payer: Self-pay | Admitting: Geriatric Medicine

## 2013-08-27 ENCOUNTER — Non-Acute Institutional Stay: Payer: Medicare Other | Admitting: Geriatric Medicine

## 2013-08-27 DIAGNOSIS — R1312 Dysphagia, oropharyngeal phase: Secondary | ICD-10-CM

## 2013-08-27 DIAGNOSIS — J310 Chronic rhinitis: Secondary | ICD-10-CM

## 2013-08-27 NOTE — Progress Notes (Signed)
Patient ID: [name], female   DOB: 08/30/1923, 80 y.o.   MRN: [id number]   [facility] [phone])   Contact Information   Name Relation Home Work Mobile   [name],[name] Daughter   [phone]       Chief Complaint  Patient presents with  . Emesis  . Nasal Congestion  . excessive oral secretions    HPI: This is a 80 y.o. female resident of [facility], [facility]isted [facility]  section.  Evaluation is requested today due to episode of vomiting followed by gagging and spitting up excessive mucus yesterday. Patient was placed on a clear liquid diet. She's had no further vomiting, no abdominal pain, cramping, diarrhea or other GI symptoms. Caregiver does report patient had less congestion when using decongestant Last visit: 08/18/2013  Allergic rhinitis Chronic problem for this patient. Antihistamine was changed to Chlor-Trimeton several weeks ago with no improvement. She's experiencing increased clear drainage as well as nasal congestion today. Also having significant postnasal drip drainage causing some nausea and vomiting. Will add decongestant for several days also change nasal spray 2 combination antihistamine and steroid. Monitor for effectiveness    Allergies  Allergen Reactions  . Codeine     MEDICATIONS -  Reviewed  DATA REVIEWED  Radiologic Exams:   Laboratory Studies:   REVIEW OF SYSTEMS  DATA OBTAINED: from patient, caregiver, family member GENERAL: Feels "good today   No recent fever, fatigue. eating small amount of clear liquid diet SKIN: No itch, rash or open wounds EYES: No eye pain, dryness or itching  No change in vision EARS: No earache, tinnitus, change in hearing NOSE: Congestion, drainage present, drainage is clear MOUTH/THROAT: No mouth or tooth pain  No sore throat   No difficulty chewing or swallowing RESPIRATORY: Cough present, No wheezing, SOB CARDIAC: No chest pain, palpitations  No edema. GI: No  abdominal pain  Mild nausea, 2 episodes of vomiting in last 2 days. No diarrhea or constipation  No heartburn or reflux  NEUROLOGIC: No dizziness, fainting, headache  No change in mental status (dementia).  PSYCHIATRIC: No feelings of anxiety, depression  Sleeps well.  No behavior issue.    PHYSICAL EXAM Filed Vitals:   08/27/13 1444  BP: 113/63  Pulse: 79  Temp: 97.4 F (36.3 C)  Resp: 20  SpO2: 93%   There is no weight on file to calculate BMI.  GENERAL APPEARANCE: No acute distress, appropriately groomed, normal body habitus. Alert, pleasant, conversant,Voice with nasal quality. Marland Kitchen SKIN: No diaphoresis, rash, unusual lesions, wounds HEAD: Normocephalic, atraumatic EYES: Conjunctiva/lids clear. Pupils round, reactive.   EARS: External exam WNL, canals clear, diminished Hearing (chronic). NOSE: No deformity or discharge. Clear drainage evident. Frontal and maxillary sinuses nontender MOUTH/THROAT: Lips w/o lesions. Oral mucosa, tongue moist, w/o lesion. Oropharynx w/o redness or lesions. NECK: Supple, full ROM. No thyroid tenderness, enlargement or nodule LYMPHATICS: No head, neck or supraclavicular adenopathy RESPIRATORY: Breathing is even, unlabored. Lung sounds are clear and full.  CARDIOVASCULAR: Heart RRR. No murmur or extra heart sounds NEUROLOGIC: Oriented to person. Seech clear, no tremor.  PSYCHIATRIC: Mood and affect appropriate to situation   ASSESSMENT/PLAN  Rhinitis, chronic Current treatment is not managing this patient's chronic rhinitis symptoms. Will try combination antihistamine decongestant for control of excess secretions and nasal congestion. At the morning this combination in the past due to possible side effects however her symptoms are significantly impairing her quality of life and ability to eat and drink.  Dysphagia, oropharyngeal phase  Nurse reports difficulty swallowing some medications, also has difficulty handling excess secretions. Has not been  eating very well though no reports of coughing or choking with meals. Lost 5lbs in last 2 months. Will simplify medication list, resume regular diet today. Add Nurse, adult nutritional supplement daily    Family/ staff Communication:    Labs/tests ordered:    Follow up: Return in about 5 weeks (around 09/28/2013) for OV w/Dr.Green Or as needed.  Toribio Harbour, NP-C [facility] [phone]  08/27/2013

## 2013-08-30 NOTE — Assessment & Plan Note (Signed)
Nurse reports difficulty swallowing some medications, also has difficulty handling excess secretions. Has not been eating very well though no reports of coughing or choking with meals. Lost 5lbs in last 2 months. Will simplify medication list, resume regular diet today. Add [employer] nutritional supplement daily

## 2013-08-30 NOTE — Assessment & Plan Note (Signed)
Current treatment is not managing this patient's chronic rhinitis symptoms. Will try combination antihistamine decongestant for control of excess secretions and nasal congestion. At the morning this combination in the past due to possible side effects however her symptoms are significantly impairing her quality of life and ability to eat and drink.

## 2013-09-17 ENCOUNTER — Non-Acute Institutional Stay (SKILLED_NURSING_FACILITY): Payer: Medicare Other | Admitting: Geriatric Medicine

## 2013-09-17 ENCOUNTER — Encounter: Payer: Self-pay | Admitting: Geriatric Medicine

## 2013-09-17 DIAGNOSIS — R1110 Vomiting, unspecified: Secondary | ICD-10-CM | POA: Insufficient documentation

## 2013-09-17 LAB — CBC AND DIFFERENTIAL
HEMATOCRIT: 34 % — AB (ref 36–46)
HEMOGLOBIN: 11.7 g/dL — AB (ref 12.0–16.0)
Platelets: 193 10*3/uL (ref 150–399)
WBC: 11.7 10^3/mL

## 2013-09-17 LAB — BASIC METABOLIC PANEL
BUN: 23 mg/dL — AB (ref 4–21)
Creatinine: 1.6 mg/dL — AB (ref 0.5–1.1)
Glucose: 111 mg/dL
Potassium: 4.1 mmol/L (ref 3.4–5.3)
SODIUM: 125 mmol/L — AB (ref 137–147)

## 2013-09-17 LAB — TSH: TSH: 0.94 u[IU]/mL (ref 0.41–5.90)

## 2013-09-17 MED ORDER — METHOTREXATE SODIUM CHEMO INJECTION 25 MG/ML: INTRAMUSCULAR | Status: DC

## 2013-09-17 NOTE — Assessment & Plan Note (Signed)
Intermittent vomiting, now more frequently. Patient continues to complain of excess mucus secretions not evident today. Patient's had weight loss, likely as quite dry. Recommend transfer to rehabilitation section for IV fluids, lab analysis and close observation.

## 2013-09-17 NOTE — Progress Notes (Signed)
Patient ID: [name], female   DOB: Sep 15, 1923, 80 y.o.   MRN: [id number]   [facility] [phone])   Contact Information   Name Relation Home Work Mobile   [name],[name] Daughter   [phone]       Chief Complaint  Patient presents with  . Emesis    HPI: This is a 80 y.o. female resident of [facility]isted [facility]  section.  Evaluation is requested today due to intermittent vomiting, generally poor p.o. intake over the last 2 weeks, decreased functional status.   Last visit: Rhinitis, chronic Current treatment is not managing this patient's chronic rhinitis symptoms. Will try combination antihistamine decongestant for control of excess secretions and nasal congestion. Have avoided this combination in the past due to possible side effects however her symptoms are significantly impairing her quality of life and ability to eat and drink.  Dysphagia, oropharyngeal phase Nurse reports difficulty swallowing some medications, also has difficulty handling excess secretions. Has not been eating very well though no reports of coughing or choking with meals. Lost 5lbs in last 2 months. Will simplify medication list, resume regular diet today. Add [employer] nutritional supplement daily  Since last visit Resource nutritional supplement was stopped due to daughter's concern this was causing more mucus production. Patient's daily caregiver reports vomiting occurs nearly daily, sometimes after breakfast sometimes after lunch. Vomitus consists of clear mucus and chunks of food. She does note that the day after she has vomiting episodes she is quite weak, requires increased assistance with basic ADLs and ambulation. At least one night during this week patient has vomited overnight.  Review of records shows patient has lost 5 pounds in the last month, otherwise vital signs have been stable. Private caregiver and nursing staff report patient has  been eating and drinking minimal amounts for the last week.   Allergies  Allergen Reactions  . Codeine     MEDICATIONS -  Reviewed  DATA REVIEWED  Radiologic Exams:   Laboratory Studies: Lab Results  Component Value Date   WBC 7.8 05/26/2013   HGB 13.0 05/26/2013   HCT 38 05/26/2013   MCV 96.8 01/23/2010   PLT 225 05/26/2013   Lab Results  Component Value Date   NA 138 03/24/2013   K 3.8 05/26/2013   GLU 175 05/26/2013   BUN 27* 03/24/2013   CREATININE 1.5* 05/26/2013     REVIEW OF SYSTEMS  DATA OBTAINED: from patient, caregiver, family member GENERAL: Feels "so-so" - SEE HPI   No recent fever.  SKIN: No itch, rash or open wounds EYES: No eye pain, dryness or itching  No change in vision EARS: No earache, tinnitus, change in hearing NOSE: C/o nasal congestion, drainage  MOUTH/THROAT: No mouth or tooth pain  No sore throat   No difficulty chewing or swallowing RESPIRATORY:  No cough, wheezing, SOB CARDIAC: No chest pain, palpitations  No edema. GI: No abdominal pain  Mild nausea, vomiting twice since lunch. No diarrhea or constipation, last BM today. No heartburn or reflux  NEUROLOGIC: No dizziness, fainting, headache  Change in mental status: recently does not recognize son in law, .  PSYCHIATRIC: No sign of anxiety, depression  Sleeps well.     PHYSICAL EXAM Filed Vitals:   09/17/13 1348  BP: 127/77  Pulse: 77  Temp: 98.1 F (36.7 C)  Resp: 20  Weight: 107 lb 12.8 oz (48.898 kg)  SpO2: 98%   Body mass index is 22.14 kg/(m^2).  GENERAL APPEARANCE:  No acute distress, appropriately groomed, normal body habitus. Alert, pleasant, conversant,. SKIN: No diaphoresis, rash, unusual lesions, wounds HEAD: Normocephalic, atraumatic EYES: Conjunctiva/lids clear. Pupils round, reactive.   EARS: External exam WNL, canals clear, diminished Hearing (chronic). NOSE: No deformity or discharge.  MOUTH/THROAT: Lips w/o lesions. Oral mucosa, tongue moist, w/o lesion. Oropharynx w/o  redness or lesions. NECK: Supple, full ROM. No thyroid tenderness, enlargement or nodule LYMPHATICS: No head, neck or supraclavicular adenopathy RESPIRATORY: Breathing is even, unlabored. Lung sounds are clear and full.  CARDIOVASCULAR: Heart RRR. No murmur or extra heart sounds GASTROINTESTINAL: Abdomen is soft, non-tender, not distended w/ hyperactive bowel sounds.  NEUROLOGIC: Oriented to person. Seech clear, no tremor.  PSYCHIATRIC: Mood and affect appropriate to situation   ASSESSMENT/PLAN  Vomiting Intermittent vomiting, now more frequently. Patient continues to complain of excess mucus secretions not evident today. Patient's had weight loss, likely as quite dry. Recommend transfer to rehabilitation section for IV fluids, lab analysis and close observation.    Family/ staff Communication:  Discussed today's findings and plan with the daughter [name]. She agrees with transfer to rehabilitation.  Time: 45 minutes, >50% spent counseling/or care coordination   Labs/tests ordered: CBC, CMP, TSH   Follow up: Return for As needed.  Toribio Harbour, NP-C [facility] [phone]  09/17/2013

## 2013-09-18 ENCOUNTER — Encounter: Payer: Self-pay | Admitting: Geriatric Medicine

## 2013-09-18 ENCOUNTER — Non-Acute Institutional Stay (SKILLED_NURSING_FACILITY): Payer: Medicare Other | Admitting: Geriatric Medicine

## 2013-09-18 DIAGNOSIS — R1110 Vomiting, unspecified: Secondary | ICD-10-CM

## 2013-09-18 DIAGNOSIS — E871 Hypo-osmolality and hyponatremia: Secondary | ICD-10-CM | POA: Insufficient documentation

## 2013-09-18 NOTE — Assessment & Plan Note (Signed)
No vomiting since yesterday afternoon- very minimal PO intake. Continue IVF, PO intake as tolerated

## 2013-09-18 NOTE — Assessment & Plan Note (Signed)
Mild hyponatremia, may be contributing to GI issues, change IVF to NaCl.

## 2013-09-18 NOTE — Progress Notes (Signed)
Patient ID: [name], female   DOB: Oct 04, 1923, 80 y.o.   MRN: [id number]   [facility] SN[facility] [phone])   Contact Information   Name Relation Home Work Mobile   [name],[name] Daughter   [phone]       Chief Complaint  Patient presents with  . Emesis    HPI: This is a 80 y.o. female resident of [facility], Assisted Living  section.  Evaluated today in followup of intermittent vomiting, generally poor p.o. Intake, decreased functional status.   Last visit: Vomiting Intermittent vomiting, now more frequently. Patient continues to complain of excess mucus secretions not evident today. Patient's had weight loss, likely as quite dry. Recommend transfer to rehabilitation section for IV fluids, lab analysis and close observation.   Since last visit   Allergies  Allergen Reactions  . Codeine     MEDICATIONS -  Reviewed  DATA REVIEWED  Radiologic Exams:   Laboratory Studies: Lab Results  Component Value Date   WBC 7.8 05/26/2013   HGB 13.0 05/26/2013   HCT 38 05/26/2013   MCV 96.8 01/23/2010   PLT 225 05/26/2013   Lab Results  Component Value Date   NA 138 03/24/2013   K 3.8 05/26/2013   GLU 175 05/26/2013   BUN 27* 03/24/2013   CREATININE 1.5* 05/26/2013    Lab Results  Component Value Date   WBC 11.7 09/17/2013   HGB 11.7* 09/17/2013   HCT 34* 09/17/2013   MCV 96.8 01/23/2010   PLT 193 09/17/2013   Lab Results  Component Value Date   NA 125* 09/17/2013   K 4.1 09/17/2013   GLU 111 09/17/2013   BUN 23* 09/17/2013   CREATININE 1.6* 09/17/2013    Lab Results  Component Value Date   TSH 0.94 09/17/2013     REVIEW OF SYSTEMS  DATA OBTAINED: from patient, caregiver, family member GENERAL: Feels "so-so" - SEE HPI   No recent fever.  SKIN: No itch, rash or open wounds EYES: No eye pain, dryness or itching  No change in vision EARS: No earache, tinnitus, change in hearing NOSE: C/o nasal congestion, drainage  MOUTH/THROAT: No mouth  or tooth pain  No sore throat   No difficulty chewing or swallowing RESPIRATORY:  No cough, wheezing, SOB CARDIAC: No chest pain, palpitations  No edema. GI: No abdominal pain  No nausea "yet" today,  No diarrhea or constipation, large BM last evening. No heartburn or reflux  NEUROLOGIC: No dizziness, fainting, headache  Change in mental status: recently does not recognize son in law, .  PSYCHIATRIC: No sign of anxiety, depression  Sleeps well.     PHYSICAL EXAM Filed Vitals:   09/18/13 1204  BP: 141/63  Pulse: 77  Temp: 97.1 F (36.2 C)  Resp: 29  SpO2: 95%   There is no weight on file to calculate BMI.  GENERAL APPEARANCE: No acute distress, appropriately groomed, normal body habitus. Alert, pleasant, conversant,. SKIN: No diaphoresis, rash, unusual lesions, wounds HEAD: Normocephalic, atraumatic EYES: Conjunctiva/lids clear. Pupils round, reactive.   EARS: External exam WNL, canals clear, diminished Hearing (chronic). NOSE: No deformity or discharge.  MOUTH/THROAT: Lips w/o lesions. Oral mucosa, tongue moist, w/o lesion. Oropharynx w/o redness or lesions. NECK: Supple, full ROM. No thyroid tenderness, enlargement or nodule LYMPHATICS: No head, neck or supraclavicular adenopathy RESPIRATORY: Breathing is even, unlabored. Lung sounds are clear and full.  CARDIOVASCULAR: Heart RRR. No murmur or extra heart sounds GASTROINTESTINAL: Abdomen is soft, non-tender, not distended w/  hyperactive bowel sounds.  NEUROLOGIC: Oriented to person only. Speech clear, no tremor.  PSYCHIATRIC: Mood and affect appropriate to situation   ASSESSMENT/PLAN  Vomiting No vomiting since yesterday afternoon- very minimal PO intake. Continue IVF, PO intake as tolerated  Hyponatremia Mild hyponatremia, may be contributing to GI issues, change IVF to NaCl.     Family/ staff Communication:   Time:   Labs/tests ordered: BMP 09/19/13  Follow up: Return for As needed.  Toribio Harbour,  NP-C [facility] [phone]  09/18/2013

## 2013-09-19 LAB — BASIC METABOLIC PANEL
BUN: 14 mg/dL (ref 4–21)
Creatinine: 1.1 mg/dL (ref 0.5–1.1)
Glucose: 71 mg/dL
Potassium: 3.6 mmol/L (ref 3.4–5.3)
SODIUM: 131 mmol/L — AB (ref 137–147)

## 2013-09-21 ENCOUNTER — Encounter: Payer: Self-pay | Admitting: Geriatric Medicine

## 2013-09-21 ENCOUNTER — Non-Acute Institutional Stay (SKILLED_NURSING_FACILITY): Payer: Medicare Other | Admitting: Geriatric Medicine

## 2013-09-21 DIAGNOSIS — R1110 Vomiting, unspecified: Secondary | ICD-10-CM

## 2013-09-21 DIAGNOSIS — F0280 Dementia in other diseases classified elsewhere without behavioral disturbance: Secondary | ICD-10-CM

## 2013-09-21 DIAGNOSIS — R1312 Dysphagia, oropharyngeal phase: Secondary | ICD-10-CM

## 2013-09-21 DIAGNOSIS — E871 Hypo-osmolality and hyponatremia: Secondary | ICD-10-CM

## 2013-09-21 DIAGNOSIS — G309 Alzheimer's disease, unspecified: Secondary | ICD-10-CM

## 2013-09-21 LAB — BASIC METABOLIC PANEL
BUN: 15 mg/dL (ref 4–21)
CREATININE: 0.9 mg/dL (ref 0.5–1.1)
GLUCOSE: 56 mg/dL
Potassium: 3.9 mmol/L (ref 3.4–5.3)
Sodium: 133 mmol/L — AB (ref 137–147)

## 2013-09-21 NOTE — Progress Notes (Signed)
Patient ID: [name], female   DOB: 26-Oct-1923, 80 y.o.   MRN: [id number]   [facility] SN[facility] [phone])   Contact Information   Name Relation Home Work Mobile   [name],[name] Daughter   [phone]       Chief Complaint  Patient presents with  . Emesis    HPI: This is a 80 y.o. female resident of [facility]isted [facility]  section.  Evaluated today in followup of intermittent vomiting, generally poor p.o. Intake, decreased functional status.   Last visit: Vomiting No vomiting since yesterday afternoon- very minimal PO intake. Continue IVF, PO intake as tolerated  Hyponatremia Mild hyponatremia, may be contributing to GI issues, change IVF to NaCl.   Since last visit there's been no further vomiting, PEEP patient continues to have poor p.o. intake with less than 25% of her meals. She is tolerating fluids fairly well. Caregiver reports patient was fed breakfast today, c/o some nausea, ate small amount, no vomiting. Repeat lab improved with improved sodium level. Patient completed 2 L of IV fluid.  Allergies  Allergen Reactions  . Codeine     MEDICATIONS -  Reviewed  DATA REVIEWED  Radiologic Exams:   Laboratory Studies: Lab Results  Component Value Date   WBC 11.7 09/17/2013   HGB 11.7* 09/17/2013   HCT 34* 09/17/2013   MCV 96.8 01/23/2010   PLT 193 09/17/2013   Lab Results  Component Value Date   NA 131* 09/19/2013   K 3.6 09/19/2013   GLU 71 09/19/2013   BUN 14 09/19/2013   CREATININE 1.1 09/19/2013    Lab Results  Component Value Date   TSH 0.94 09/17/2013     REVIEW OF SYSTEMS  DATA OBTAINED: from patient, caregiver, family member GENERAL: Feels "so-so" - SEE HPI   No recent fever.  SKIN: No itch, rash or open wounds NOSE: C/o nasal drainage  MOUTH/THROAT: No mouth or tooth pain  No sore throat   No difficulty chewing or swallowing RESPIRATORY:  No cough, wheezing, SOB CARDIAC: No chest pain, palpitations  No  edema. GI: No abdominal pain  No nausea "yet" today,  No diarrhea or constipation. No heartburn or reflux  NEUROLOGIC: No dizziness, fainting, headache  Mental status at baseline (dementia) PSYCHIATRIC: No sign of anxiety, depression  Sleeps well.  Allowed CNA to help her with shower this morning "I feel good now"   PHYSICAL EXAM Filed Vitals:   09/21/13 1219  BP: 139/65  Pulse: 91  Temp: 96.9 F (36.1 C)  Resp: 18  SpO2: 97%   There is no weight on file to calculate BMI.  GENERAL APPEARANCE: No acute distress, appropriately groomed, normal body habitus. Alert, pleasant, conversant,. SKIN: No diaphoresis, rash, unusual lesions, wounds HEAD: Normocephalic, atraumatic EYES: Conjunctiva/lids clear. Pupils round, reactive.   EARS: External exam WNL, canals clear, diminished Hearing (chronic). NOSE: No deformity or discharge.  MOUTH/THROAT: Lips w/o lesions. Oral mucosa, tongue moist, w/o lesion. Oropharynx w/o redness or lesions. NECK: Supple, full ROM. No thyroid tenderness, enlargement or nodule LYMPHATICS: No head, neck or supraclavicular adenopathy RESPIRATORY: Breathing is even, unlabored. Lung sounds are clear and full.  CARDIOVASCULAR: Heart RRR. No murmur or extra heart sounds GASTROINTESTINAL: Abdomen is soft, non-tender, not distended w/ hyperactive bowel sounds.  NEUROLOGIC: Oriented to person only. Speech clear, no tremor.  PSYCHIATRIC: Mood and affect appropriate to situation   ASSESSMENT/PLAN  Hyponatremia Improved with IV fluids though sodium remains just below normal. Repeat BNP 09/22/2013  Vomiting  No vomiting last several days. Patient has been receiving a nap, lactose free diet for the last several days, caregiver reports that ice cream is her favorite food. We'll resume regular diet and observe for any change in GI symptoms.  Dysphagia, oropharyngeal phase Oropharyngeal  dysphagia may be worsening accounting for difficulty in managing secretions,contributing  to nausea and vomiting. May benefit from altered consistency diet, will ask for ST evaluation  Alzheimer's disease Patient's cognitive and functional status has declined, currently requires skilled level of care. She requires significant assistance with all ADLs, very limited initiation.     Family/ staff Communication:  Time:   Labs/tests ordered:  BMP 09/22/13  Follow up: Return for As needed.  Toribio Harbour, NP-C [facility] [phone]  09/21/2013

## 2013-09-22 NOTE — Assessment & Plan Note (Signed)
Oropharyngeal  dysphagia may be worsening accounting for difficulty in managing secretions,contributing to nausea and vomiting. May benefit from altered consistency diet, will ask for ST evaluation

## 2013-09-22 NOTE — Assessment & Plan Note (Signed)
No vomiting last several days. Patient has been receiving a nap, lactose free diet for the last several days, caregiver reports that ice cream is her favorite food. We'll resume regular diet and observe for any change in GI symptoms.

## 2013-09-22 NOTE — Assessment & Plan Note (Signed)
Improved with IV fluids though sodium remains just below normal. Repeat BNP 09/22/2013

## 2013-09-22 NOTE — Assessment & Plan Note (Signed)
Patient's cognitive and functional status has declined, currently requires skilled level of care. She requires significant assistance with all ADLs, very limited initiation.

## 2013-09-24 ENCOUNTER — Encounter: Payer: Self-pay | Admitting: Internal Medicine

## 2013-09-24 ENCOUNTER — Non-Acute Institutional Stay (SKILLED_NURSING_FACILITY): Payer: Medicare Other | Admitting: Internal Medicine

## 2013-09-24 DIAGNOSIS — F0280 Dementia in other diseases classified elsewhere without behavioral disturbance: Secondary | ICD-10-CM

## 2013-09-24 DIAGNOSIS — R269 Unspecified abnormalities of gait and mobility: Secondary | ICD-10-CM

## 2013-09-24 DIAGNOSIS — E039 Hypothyroidism, unspecified: Secondary | ICD-10-CM

## 2013-09-24 DIAGNOSIS — R1312 Dysphagia, oropharyngeal phase: Secondary | ICD-10-CM

## 2013-09-24 DIAGNOSIS — K5900 Constipation, unspecified: Secondary | ICD-10-CM

## 2013-09-24 DIAGNOSIS — G309 Alzheimer's disease, unspecified: Secondary | ICD-10-CM

## 2013-09-24 DIAGNOSIS — M069 Rheumatoid arthritis, unspecified: Secondary | ICD-10-CM

## 2013-09-24 DIAGNOSIS — R197 Diarrhea, unspecified: Secondary | ICD-10-CM

## 2013-09-24 DIAGNOSIS — I10 Essential (primary) hypertension: Secondary | ICD-10-CM

## 2013-09-24 DIAGNOSIS — E871 Hypo-osmolality and hyponatremia: Secondary | ICD-10-CM

## 2013-09-24 DIAGNOSIS — R1110 Vomiting, unspecified: Secondary | ICD-10-CM

## 2013-09-24 NOTE — Progress Notes (Signed)
Patient ID: [name], female   DOB: September 28, 1923, 80 y.o.   MRN: [id number]    Location:  [facility] [employer] [facility] Service: SNF (31)  PCP: Kimber Relic, MD  Code Status: NCB  Extended Emergency Contact Information Primary Emergency Contact: [name],[name]  [place] Mobile Phone: [phone] Relation: Daughter  Allergies  Allergen Reactions  . Codeine     Chief Complaint  Patient presents with  . New Evaluation    transfer to Rehab/ SNF  from AL    HPI:  Was transferred from [facility] to Rehab/ SNF on 09/17/13 for worsening of problems of nausea with vomiting and loose stools that have been present off and on since at least Jan 2015 according to her caretaker. No fever, chills, blood in stool , or abd pain. Appetite has been very poor. She only takes a couple bites of food or sips of soup before she stops.  Trying to eat makes her nauseous. She is losing weight. She has a known dysphagia.   She became hyponatremic with Na+ down to 131. She needed IV for hydration. The hyponatremia improved and she is not on the IV today.  Dementia has been worsening during this time with increasing confusion. She has known Alzheimer's disease.    Past Medical History  Diagnosis Date  . Hypertension   . Hypothyroidism   . Allergic rhinitis due to pollen   . Unspecified constipation   . Stenosis of rectum and anus   . H/O cholelithiasis     post cholecystectomy 10/2005  . Urinary tract infection, site not specified   . Bunion   . Osteoporosis, unspecified   . Other symptoms involving cardiovascular system   . Closed fracture of intertrochanteric section of femur   . Alzheimer's disease 2013  . Anterior subcapsular polar senile cataract 2006  . Symptomatic cholelithiasis 10/2005    s/p laparoscopic assissted cholecystectomy  . Closed fracture of intertrochanteric section of femur 2009    s/p ORIF w/ IM rod  . Unspecified  constipation 2006  . Abnormality of gait 2012  . External hemorrhoids without mention of complication 2011  . Unspecified essential hypertension 2006  . Unspecified hypothyroidism 2006  . Urinary incontinence   . Macular degeneration (senile) of retina, unspecified 09/2010  . Senile osteoporosis 2007  . Other hammer toe (acquired) 2006  . Supraventricular premature beats 2009  . Rheumatoid arthritis(714.0) 2011  . Anal fissure 2012  . Allergic rhinitis due to pollen 2007  . Sciatica 2010  . Stenosis of rectum and anus 2006  . Unspecified disorder of skin and subcutaneous tissue 2013    keratotic horn left hand  . Pathologic fracture of vertebrae 2010  . Loss of weight 2013  . Back pain, lumbosacral 09/27/2012  . Altered mental state 09/27/2012    Past Surgical History  Procedure Laterality Date  . Hammer toe surgery  1982  . Cholecystectomy, laparoscopic  2007  . Orif hip fracture Left 08/2007    IM rod  . Wrist arthrotomy Right 05/2009    Social History: History   Social History  . Marital Status: Widowed    Spouse Name: N/A    Number of Children: N/A  . Years of Education: N/A   Social History Main Topics  . Smoking status: Former Smoker    Types: Cigarettes    Quit date: 11/29/1975  . Smokeless tobacco: Never Used  . Alcohol Use: 0.0 oz/week  Comment: 2 glasses of wine daily  . Drug Use: No  . Sexual Activity: No   Other Topics Concern  . None   Social History Narrative   Widow, husband was in the [place], retired to [employer]. Patient currently resides in Assisted Living section at [facility] since 2012. Previously lived in a single level home in same community since 2006. Does not drive, has a private caregiver during the day 5 days a week. Pet cat, [place].   Admitted to AL 09/12/11    Family History Family Status  Relation Status Death Age  . Father Deceased 3    MI  . Mother Deceased 51  . Daughter Alive   .  Daughter Deceased 70     MI  . Son Alive    Family History  Problem Relation Age of Onset  . Heart attack Father   . Coronary artery disease Father   . Hypertension Father   . Leukemia Mother   . Heart attack Daughter 68     Medications: Patient's Medications  New Prescriptions   No medications on file  Previous Medications   ACETAMINOPHEN (TYLENOL) 500 MG TABLET    Take 500 mg by mouth 3 (three) times daily. 8am, 2pm, 8pm, may have additional 500mg  every 6 hours as needed for pian. Maximum 3000mg  dailiy   AMLODIPINE (NORVASC) 10 MG TABLET    Take 10 mg by mouth at bedtime.   AZELASTINE-FLUTICASONE 137-50 MCG/ACT SUSP    Place 1 spray into the nose 2 (two) times daily.   BENZOCAINE (HURRICAINE) 20 % GEL    Place 1 application rectally 3 (three) times daily as needed. Apply small amount inside anal opening   CLONIDINE (CATAPRES) 0.1 MG TABLET    Take 0.1 mg by mouth every 6 (six) hours as needed (Systolic blood pressure > 190).   DICLOFENAC SODIUM (VOLTAREN) 1 % GEL    Apply 2-4 g topically 4 (four) times daily as needed (Apply 4grams to each hip anf lower back. Max.Dose 32/joint/day, 32g/day total).   FOLIC ACID (FOLVITE) 1 MG TABLET    Take 1 mg by mouth daily.   LEVOTHYROXINE (SYNTHROID, LEVOTHROID) 50 MCG TABLET    Take 50 mcg by mouth daily before breakfast.   LISINOPRIL (PRINIVIL,ZESTRIL) 10 MG TABLET    Take 10 mg by mouth daily.   LORATADINE-PSEUDOEPHEDRINE (CLARITIN-D 12-HOUR) 5-120 MG PER TABLET    Take 1 tablet by mouth daily.   MEMANTINE (NAMENDA) 10 MG TABLET    Take 10 mg by mouth 2 (two) times daily.   MENTHOL, TOPICAL ANALGESIC, (BIOFREEZE) 4 % GEL    Apply 1 application topically 3 (three) times daily as needed (to reduce anterior thigh pain).   METHOTREXATE 25 MG/ML INJECTION    Inject SQ .6cc weekly on Thursday   METOPROLOL TARTRATE PO    Take 50 mg by mouth 2 (two) times daily.   POLYETHYLENE GLYCOL (MIRALAX / GLYCOLAX) PACKET    Take 17 g by mouth daily. Mix in 6  ounces beverage of choice. Hold for loose stool   PREDNISOLONE 5 MG TABS    Take 5 mg by mouth daily.   SODIUM CHLORIDE (OCEAN) 0.65 % NASAL SPRAY    Place 2 sprays into the nose as needed for congestion (Up to 4x/day. May self administer, keep at bedside).  Modified Medications   No medications on file  Discontinued Medications   No medications on file    Immunization History  Administered  Date(s) Administered  . Influenza Whole 03/18/2012, 03/31/2013  . PPD Test 09/09/2011  . Pneumococcal Polysaccharide-23 06/19/1999  . Td 06/18/1994  . Zoster 06/18/2005     Review of Systems  Constitutional: Negative for fever, chills, diaphoresis, activity change, appetite change, fatigue and unexpected weight change.  HENT: Negative.   Eyes: Negative.   Respiratory: Negative for cough, choking, chest tightness, shortness of breath and wheezing.   Cardiovascular: Negative for chest pain, palpitations and leg swelling.  Gastrointestinal: Positive for nausea, vomiting and diarrhea. Negative for abdominal pain.       01/15/13 MBSS: Mild impairment  Endocrine: Negative for cold intolerance, heat intolerance, polydipsia, polyphagia and polyuria.  Genitourinary:       Incontinence of urine  Musculoskeletal:       Unstable on standing. Able to walk with assistance or wallker.  Skin: Negative.   Neurological: Negative for tremors, seizures, syncope and headaches.       Loss of memory 09/14/13 MMSE 14/30. Failed clock drawing.  Hematological: Negative.   Psychiatric/Behavioral: Negative.       Filed Vitals:   09/24/13 1702  BP: 125/66  Pulse: 73  Temp: 97.1 F (36.2 C)  Resp: 18  Height: 4' 10.5" (1.486 m)  Weight: 107 lb (48.535 kg)   Physical Exam  Constitutional:  Frail. Thin . Malnourished.  HENT:  Right Ear: External ear normal.  Left Ear: External ear normal.  Nose: Nose normal.  Mouth/Throat: Oropharynx is clear and moist. No oropharyngeal exudate.  Loss of hearing  Eyes:  Conjunctivae and EOM are normal. Pupils are equal, round, and reactive to light.  Neck: No JVD present. No tracheal deviation present. No thyromegaly present.  Cardiovascular: Normal rate, regular rhythm and intact distal pulses.  Exam reveals no gallop and no friction rub.   No murmur heard. Respiratory: No respiratory distress. She has no wheezes. She has no rales. She exhibits no tenderness.  GI: She exhibits no distension and no mass. There is no tenderness. There is no rebound and no guarding.  Musculoskeletal: She exhibits no edema and no tenderness.  Kyphosis. Low back pain. unstable on standing. Needs help to walk safely on walker.  Lymphadenopathy:    She has no cervical adenopathy.  Neurological: No cranial nerve deficit. Coordination abnormal.  Dementia. Not oriented. Very confused.  Skin: No rash noted. No erythema. No pallor.  Psychiatric: She has a normal mood and affect. Her behavior is normal.       Labs reviewed: Nursing Home on 09/21/2013  Component Date Value Ref Range Status  . Glucose 09/19/2013 71   Final  . BUN 09/19/2013 14  4 - 21 mg/dL Final  . Creatinine 16/03/9603 1.1  0.5 - 1.1 mg/dL Final  . Potassium 54/02/8118 3.6  3.4 - 5.3 mmol/L Final  . Sodium 09/19/2013 131* 137 - 147 mmol/L Final  Nursing Home on 09/18/2013  Component Date Value Ref Range Status  . Hemoglobin 09/17/2013 11.7* 12.0 - 16.0 g/dL Final  . HCT 14/78/2956 34* 36 - 46 % Final  . Platelets 09/17/2013 193  150 - 399 K/L Final  . WBC 09/17/2013 11.7   Final  . Glucose 09/17/2013 111   Final  . BUN 09/17/2013 23* 4 - 21 mg/dL Final  . Creatinine 21/30/8657 1.6* 0.5 - 1.1 mg/dL Final  . Potassium 84/69/6295 4.1  3.4 - 5.3 mmol/L Final  . Sodium 09/17/2013 125* 137 - 147 mmol/L Final  . TSH 09/17/2013 0.94  0.41 - 5.90 uIU/mL Final  Assessment/Plan 1. Vomiting Etiology is uncertain. I will discontinue seversal medications. She may need UGI and SBFT x-rays. DC: Miralax,  Namenda, Centrum, Chlortrimeton, Amlodipine, Calcium, and KCl  2. Diarrhea C.dificile assay is pending  3. Alzheimer's disease worsening  4. Hyponatremia Improved since her IV  5. Hypothyroidism compensated  6. Dysphagia, oropharyngeal phase Discontinued several large tablets  7. ESSENTIAL HYPERTENSION With the weight loss, she probably does not need as much medication. Stopped amlodipine. Remains on metoprolol, clonidine, and lisinopril  8. Unspecified constipation Current problem is loose stools, therefore I stopped Miralax  9. Rheumatoid arthritis(714.0) Remains on multiple medications, including methotrexate.  10. Abnormality of gait High risk for falls.

## 2013-09-28 ENCOUNTER — Encounter: Payer: Self-pay | Admitting: Geriatric Medicine

## 2013-09-28 ENCOUNTER — Non-Acute Institutional Stay (SKILLED_NURSING_FACILITY): Payer: Medicare Other | Admitting: Geriatric Medicine

## 2013-09-28 ENCOUNTER — Encounter: Payer: Self-pay | Admitting: Internal Medicine

## 2013-09-28 DIAGNOSIS — R1110 Vomiting, unspecified: Secondary | ICD-10-CM

## 2013-09-28 NOTE — Progress Notes (Signed)
Patient ID: [name], female   DOB: [birth year], 80 y.o.   MRN: [id number]   [facility] SNF [phone])   Contact Information   Name Relation Home Work Mobile   [name],[name] Daughter   [phone]       Chief Complaint  Patient presents with  . Gastroparesis    HPI: This is a 80 y.o. female resident of [facility]  section.  Evaluated today in followup of intermittent vomiting, generally poor p.o. Intake, decreased functional status.   Last visit: Vomiting No vomiting last several days. Patient has been receiving a nap, lactose free diet for the last several days, caregiver reports that ice cream is her favorite food. We'll resume regular diet and observe for any change in GI symptoms.  Dysphagia, oropharyngeal phase Oropharyngeal  dysphagia may be worsening accounting for difficulty in managing secretions,contributing to nausea and vomiting. May benefit from altered consistency diet, will ask for ST evaluation  Alzheimer's disease Patient's cognitive and functional status has declined, currently requires skilled level of care. She requires significant assistance with all ADLs, very limited initiation.   Since last visit  patient had recurrent vomiting. Reglan started for presumed gastroparesis, no vomiting the last 48 hours. She is tolerating small amounts of solid foods, and small sips of fluids. Most recent BMP with mildly improved sodium level. Patient continues to require two-person assist for transfers and maximal assistance for all other ADLs   Allergies  Allergen Reactions  . Codeine     MEDICATIONS -  Reviewed  DATA REVIEWED  Radiologic Exams:   Laboratory Studies: Lab Results  Component Value Date   WBC 11.7 09/17/2013   HGB 11.7* 09/17/2013   HCT 34* 09/17/2013   MCV 96.8 01/23/2010   PLT 193 09/17/2013   Lab Results  Component Value Date   NA 131* 09/19/2013   K 3.6 09/19/2013   GLU 71 09/19/2013   BUN 14 09/19/2013   CREATININE 1.1 09/19/2013    Lab Results  Component Value Date   TSH 0.94 09/17/2013   Lab Results  Component Value Date   NA 133* 09/21/2013   K 3.9 09/21/2013   GLU 56 09/21/2013   BUN 15 09/21/2013   CREATININE 0.9 09/21/2013    REVIEW OF SYSTEMS  DATA OBTAINED: from patient, caregiver, family member GENERAL: Feels "so-so" - SEE HPI   No recent fever.  SKIN: No itch, rash or open wounds NOSE: C/o nasal drainage  MOUTH/THROAT: No mouth or tooth pain  No sore throat   No difficulty chewing or swallowing RESPIRATORY:  No cough, wheezing, SOB CARDIAC: No chest pain, palpitations  No edema. GI: No abdominal pain  No nausea "yet" today,  No diarrhea or constipation. No heartburn or reflux  NEUROLOGIC: No dizziness, fainting, headache  Mental status at baseline (dementia) PSYCHIATRIC: No sign of anxiety, depression  Sleeps well.      PHYSICAL EXAM Filed Vitals:   09/28/13 1454  BP: 137/67  Pulse: 96  Temp: 96.8 F (36 C)  Resp: 18  SpO2: 96%   There is no weight on file to calculate BMI.  GENERAL APPEARANCE: No acute distress, appropriately groomed, normal body habitus. Alert, pleasant, conversant,. SKIN: No diaphoresis, rash, unusual lesions, wounds HEAD: Normocephalic, atraumatic EYES: Conjunctiva/lids clear. Pupils round, reactive.   NOSE: No deformity or discharge.  MOUTH/THROAT: Lips w/o lesions. Oral mucosa, tongue moist, w/o lesion. Oropharynx w/o redness or lesions. LYMPHATICS: No head, neck or supraclavicular adenopathy RESPIRATORY: Breathing  is even, unlabored. Lung sounds are clear and full.  CARDIOVASCULAR: Heart RRR. No murmur or extra heart sounds GASTROINTESTINAL: Abdomen is soft, non-tender, not distended w/ normal bowel sounds.  NEUROLOGIC: Oriented to person only. Speech clear, no tremor.  PSYCHIATRIC: Mood and affect appropriate to situation   ASSESSMENT/PLAN  Vomiting Metoclopramide started as empiric treatment for presumed gastroparesis.  No vomiting, no adverse effects from medication at this point. Continue encouraging p.o. intake if she tolerates; offering 5 small meals a day and fluids every hour.    Family/ staff Communication:  Time:   Labs/tests ordered:     Follow up: Return for As needed.  Toribio Harbour, NP-C [facility] [phone]  09/28/2013

## 2013-10-01 ENCOUNTER — Non-Acute Institutional Stay (SKILLED_NURSING_FACILITY): Payer: Medicare Other | Admitting: Geriatric Medicine

## 2013-10-01 ENCOUNTER — Encounter: Payer: Self-pay | Admitting: Geriatric Medicine

## 2013-10-01 DIAGNOSIS — G309 Alzheimer's disease, unspecified: Secondary | ICD-10-CM

## 2013-10-01 DIAGNOSIS — R627 Adult failure to thrive: Secondary | ICD-10-CM

## 2013-10-01 DIAGNOSIS — R1110 Vomiting, unspecified: Secondary | ICD-10-CM

## 2013-10-01 DIAGNOSIS — F0280 Dementia in other diseases classified elsewhere without behavioral disturbance: Secondary | ICD-10-CM

## 2013-10-01 NOTE — Progress Notes (Signed)
Patient ID: [name], female   DOB: 10/27/1923, 80 y.o.   MRN: [id number]   [facility] [phone])   Contact Information   Name Relation Home Work Mobile   [name],[name] Daughter   [phone]       Chief Complaint  Patient presents with  . Failure To Thrive  . Dementia    HPI: This is a 80 y.o. female resident of [facility], Assisted Living section currently residing in the rehabilitation section due to declining cognitive and functional status.   Last visit: Vomiting Metoclopramide started as empiric treatment for presumed gastroparesis. No vomiting, no adverse effects from medication at this point. Continue encouraging p.o. intake if she tolerates; offering 5 small meals a day and fluids every hour.    Since last visit patient has had further change with significant decline once again. She's not had any further vomiting however she is taking very small amounts of p.o. food or fluids. On Tuesday, 09/29/2013 patient was nearly unresponsive. She was not eating and drinking at all could not follow any commands. Discussion was had with patient's daughter regarding goals of care and aggressiveness of treatment. Agreed to try IV fluids one more time to see if she will respond and be able to increase her p.o. intake. Daughter is well aware this patient's Alzheimer's disease is progressing and she is exhibiting significant signs of failure to thrive.  Patient has received 2 L of IV fluids. Today she is awake, alert participating in ADLs. Continues to require maximum assistance for completion of all ADLs. Today she is able to be assisted to stand and transfer with her walker, did attend music program earlier today. Patient has been eating small amounts of foods including bacon and eggs for breakfast.. No vomiting    Allergies  Allergen Reactions  . Codeine     MEDICATIONS -  reviewed  DATA REVIEWED  Radiologic Exams:   Laboratory  Studies: Lab Results  Component Value Date   WBC 11.7 09/17/2013   HGB 11.7* 09/17/2013   HCT 34* 09/17/2013   MCV 96.8 01/23/2010   PLT 193 09/17/2013   Lab Results  Component Value Date   NA 133* 09/21/2013   K 3.9 09/21/2013   GLU 56 09/21/2013   BUN 15 09/21/2013   CREATININE 0.9 09/21/2013     REVIEW OF SYSTEMS  DATA OBTAINED: from patient, nurse, medical record, caregiver GENERAL: Feels "OK"   See HPI.  Persistent weight loss RESPIRATORY: No cough, wheezing, SOB. Complaints of "drippy nose" CARDIAC: No chest pain, palpitations. No edema GI: No abdominal pain  No Nausea,vomiting,diarrhea or constipation  No heartburn or reflux  MUSCULOSKELETAL: No joint pain, swelling or stiffness   No back pain    No muscle ache, pain, weakness    Gait is steady    No recent falls  NEUROLOGIC: No dizziness, fainting, headache. Improved LOC in last 24 hrs - see HPI  PSYCHIATRIC: No signs of anxiety, depression  Sleeps well   No behavior issue   PHYSICAL EXAM Filed Vitals:   10/01/13 1657  BP: 138/68  Pulse: 80  Temp: 96.8 F (36 C)  Resp: 18  Weight: 97 lb (43.999 kg)  SpO2: 96%   Body mass index is 19.93 kg/(m^2).  GENERAL APPEARANCE: No acute distress, appropriately groomed, normal body habitus. Alert, pleasant, participates in brief conversation.  SKIN: No diaphoresis, rash, HEAD: Normocephalic, atraumatic EYES: Conjunctiva/lids clear. Pupils round, reactive.  EARS: Poor Hearing (chronic) NOSE: No deformity or  discharge. Clear nasal discharge MOUTH/THROAT: Lips w/o lesions. Oral mucosa, tongue moist, w/o lesion. Oropharynx w/o redness or lesions.  LYMPHATICS: No head, neck or supraclavicular adenopathy RESPIRATORY: Breathing is even, unlabored. Lung sounds are clear and full.  CARDIOVASCULAR: Heart RRR. No murmur or extra heart sounds  EDEMA: No peripheral edema. GASTROINTESTINAL: Abdomen is soft, non-tender, not distended w/ normal bowel sounds. MUSCULOSKELETAL: Moves UE extremities slowly  with full ROM, strength and tone. Unable to stand from chair to walker even with assistance times one.  NEUROLOGIC: Not Oriented to time, place, Remains oriented toperson. Speech very soft, sometimes mumbles, no tremor.  PSYCHIATRIC: Mood and affect appropriate to situation, more engaged than several days ago   ASSESSMENT/PLAN  FTT (failure to thrive) in adult Patient with known diagnosis of Alzheimer's dementia with progressive decline in cognitive and functional status. She is exhibiting significant weight loss due to poor p.o. Intake; 15 pound weight loss in the last month, 10 pounds in the last week. PMI history from 23-19.9. Her functional status is significantly declined as well corresponds to FAST stage 6.   Alzheimer's disease This patient with continued decline in cognitive and functional status. Poor p.o. intake related to recent vomiting oh she has exhibited poor p.o. intake for many months. Extensive discussion with patient's daughter earlier this week regarding goals of care. Daughter is well aware of progressive nature of Alzheimer's disease, wishes to maintain as much quality in her mother's life as possible does not wish any aggressive treatments. She has made some modest improvement with most recent intervention of IV fluids. Will continue to provide supportive care, continue medications for now.  Vomiting Vomiting fear appears to have been resolved with use of metoclopramide. Continue to monitor p.o. Intake.    Family/ staff Communication:  AS above    Labs/tests ordered:     Follow up: Return for As needed.  Toribio Harbour, NP-C [facility] [phone]  10/01/2013

## 2013-10-02 ENCOUNTER — Encounter: Payer: Self-pay | Admitting: Geriatric Medicine

## 2013-10-02 DIAGNOSIS — R627 Adult failure to thrive: Secondary | ICD-10-CM | POA: Insufficient documentation

## 2013-10-02 HISTORY — DX: Adult failure to thrive: R62.7

## 2013-10-02 NOTE — Assessment & Plan Note (Addendum)
This patient with continued decline in cognitive and functional status. Poor p.o. intake related to recent vomiting oh she has exhibited poor p.o. intake for many months. Extensive discussion with patient's daughter earlier this week regarding goals of care. Daughter is well aware of progressive nature of Alzheimer's disease, wishes to maintain as much quality in her mother's life as possible does not wish any aggressive treatments. She has made some modest improvement with most recent intervention of IV fluids. Will continue to provide supportive care, continue medications for now.

## 2013-10-02 NOTE — Assessment & Plan Note (Signed)
Vomiting fear appears to have been resolved with use of metoclopramide. Continue to monitor p.o. Intake.

## 2013-10-02 NOTE — Assessment & Plan Note (Signed)
Patient with known diagnosis of Alzheimer's dementia with progressive decline in cognitive and functional status. She is exhibiting significant weight loss due to poor p.o. Intake; 15 pound weight loss in the last month, 10 pounds in the last week. PMI history from 23-19.9. Her functional status is significantly declined as well corresponds to FAST stage 6.

## 2013-10-02 NOTE — Assessment & Plan Note (Signed)
Metoclopramide started as empiric treatment for presumed gastroparesis. No vomiting, no adverse effects from medication at this point. Continue encouraging p.o. intake if she tolerates; offering 5 small meals a day and fluids every hour.

## 2013-10-08 ENCOUNTER — Encounter: Payer: Self-pay | Admitting: Geriatric Medicine

## 2013-10-08 ENCOUNTER — Non-Acute Institutional Stay (SKILLED_NURSING_FACILITY): Payer: Medicare Other | Admitting: Geriatric Medicine

## 2013-10-08 DIAGNOSIS — M069 Rheumatoid arthritis, unspecified: Secondary | ICD-10-CM

## 2013-10-08 DIAGNOSIS — R1312 Dysphagia, oropharyngeal phase: Secondary | ICD-10-CM

## 2013-10-08 DIAGNOSIS — F0280 Dementia in other diseases classified elsewhere without behavioral disturbance: Secondary | ICD-10-CM

## 2013-10-08 DIAGNOSIS — K5900 Constipation, unspecified: Secondary | ICD-10-CM

## 2013-10-08 DIAGNOSIS — R627 Adult failure to thrive: Secondary | ICD-10-CM

## 2013-10-08 DIAGNOSIS — G309 Alzheimer's disease, unspecified: Secondary | ICD-10-CM

## 2013-10-08 DIAGNOSIS — I10 Essential (primary) hypertension: Secondary | ICD-10-CM

## 2013-10-08 NOTE — Progress Notes (Signed)
Patient ID: [name], female   DOB: Nov 27, 1923, 80 y.o.   MRN: [id number]   [facility] SN[facility] [phone])   Contact Information   Name Relation Home Work Mobile   [name],[name] Daughter   [phone]       Chief Complaint  Patient presents with  . Failure To Thrive  . Alzheimer disease    HPI: This is a 80 y.o. female resident of [facility], Assisted Living section currently residing in the rehabilitation section due to declining cognitive and functional status.   Last visit: FTT (failure to thrive) in adult Patient with known diagnosis of Alzheimer's dementia with progressive decline in cognitive and functional status. She is exhibiting significant weight loss due to poor p.o. Intake; 15 pound weight loss in the last month, 10 pounds in the last week. PMI history from 23-19.9. Her functional status is significantly declined as well corresponds to FAST stage 6.   Alzheimer's disease This patient with continued decline in cognitive and functional status. Poor p.o. intake related to recent vomiting oh she has exhibited poor p.o. intake for many months. Extensive discussion with patient's daughter earlier this week regarding goals of care. Daughter is well aware of progressive nature of Alzheimer's disease, wishes to maintain as much quality in her mother's life as possible does not wish any aggressive treatments. She has made some modest improvement with most recent intervention of IV fluids. Will continue to provide supportive care, continue medications for now.  Vomiting Vomiting fear appears to have been resolved with use of metoclopramide. Continue to monitor p.o. Intake.   Since last visit no emesis reported, patient is eating about 25% of meals, drinking 150-338ml/shift with great encouragement. Nurse reports patient has fluctuating level of alertness; was able to enjoy family visit yesterday.   Allergies  Allergen Reactions  .  Codeine     MEDICATIONS -  reviewed  DATA REVIEWED  Radiologic Exams:   Laboratory Studies: Lab Results  Component Value Date   WBC 11.7 09/17/2013   HGB 11.7* 09/17/2013   HCT 34* 09/17/2013   MCV 96.8 01/23/2010   PLT 193 09/17/2013   Lab Results  Component Value Date   NA 133* 09/21/2013   K 3.9 09/21/2013   GLU 56 09/21/2013   BUN 15 09/21/2013   CREATININE 0.9 09/21/2013     REVIEW OF SYSTEMS  DATA OBTAINED: from patient, nurse, medical record, caregiver GENERAL: Feels "OK"   See HPI.  No weigh this week RESPIRATORY: No cough, wheezing, SOB.  CARDIAC: No chest pain, palpitations. No edema GI: No abdominal pain  No Nausea,vomiting,diarrhea or constipation  No heartburn or reflux. Last BM yesterday MUSCULOSKELETAL: No joint pain, swelling or stiffness   No back pain    No muscle ache, pain, weakness   NEUROLOGIC: No dizziness, fainting, headache. Fluctuating LOC - see HPI  PSYCHIATRIC: No signs of anxiety,  Sleeps well   No behavior issue   PHYSICAL EXAM Filed Vitals:   10/08/13 1559  BP: 138/78  Pulse: 106  Temp: 97.2 F (36.2 C)  Resp: 14  SpO2: 97%   There is no weight on file to calculate BMI.  GENERAL APPEARANCE: No acute distress, appropriately groomed, Frail body habitus. Alert, pleasant, participates in brief conversation.  SKIN: No diaphoresis, rash, HEAD: Normocephalic, atraumatic EYES: Conjunctiva/lids clear. Pupils round, reactive.  EARS: Poor Hearing (chronic) NOSE: No deformity or discharge.  MOUTH/THROAT: Lips w/o lesions. Oral mucosa, tongue moist, w/o lesion. Able to drink small  amount of cola, mild cough follows  LYMPHATICS: No head, neck or supraclavicular adenopathy RESPIRATORY: Breathing is even, unlabored. Lung sounds are clear anterior CARDIOVASCULAR: Heart RRR. No murmur or extra heart sounds  EDEMA: No peripheral edema. GASTROINTESTINAL: Abdomen is soft, non-tender, not distended w/ normal bowel sounds.  NEUROLOGIC: Not Oriented to time, place,   Speech very soft, sometimes mumbles, no tremor.  PSYCHIATRIC: Mood and affect appropriate to situation, engaged in visit   ASSESSMENT/PLAN  FTT (failure to thrive) in adult Continues with poor PO intake, limited activity. Continue to provide support, encourage activity and PO intake as she tolerates  Alzheimer's disease Severe stage with minimal communication and activity.   ESSENTIAL HYPERTENSION BP range 117- 144/63-78, P 65-88 last several days, continue metoprolol, lisinopril for now.  Dysphagia, oropharyngeal phase ST reports patient coughs with any consistency liquids, no sign of respiratory compromise today. Continue PO food/ fluids as she tolerates  Unspecified constipation No BMs for several days earlier this week, Senokot-S qhsstarted with good result  Rheumatoid arthritis(714.0) Patient with significant decline since last visit with Dr.Hawkes/Erin Wallace Cullens, [place]. Will send message asking whether methotrexate/ prednisone to be continued    Family/ staff Communication:      Labs/tests ordered:     Follow up: Return for As needed.  Toribio Harbour, NP-C [facility] [phone]  10/08/2013

## 2013-10-08 NOTE — Assessment & Plan Note (Signed)
ST reports patient coughs with any consistency liquids, no sign of respiratory compromise today. Continue PO food/ fluids as she tolerates

## 2013-10-08 NOTE — Assessment & Plan Note (Signed)
Patient with significant decline since last visit with Dr.Hawkes/Erin Wallace Cullens, [place]. Will send message asking whether methotrexate/ prednisone to be continued

## 2013-10-08 NOTE — Assessment & Plan Note (Signed)
No BMs for several days earlier this week, Senokot-S qhsstarted with good result

## 2013-10-08 NOTE — Assessment & Plan Note (Signed)
Severe stage with minimal communication and activity.

## 2013-10-08 NOTE — Assessment & Plan Note (Signed)
Continues with poor PO intake, limited activity. Continue to provide support, encourage activity and PO intake as she tolerates

## 2013-10-08 NOTE — Assessment & Plan Note (Signed)
BP range 117- 144/63-78, P 65-88 last several days, continue metoprolol, lisinopril for now.

## 2013-10-15 ENCOUNTER — Non-Acute Institutional Stay (SKILLED_NURSING_FACILITY): Payer: Medicare Other | Admitting: Geriatric Medicine

## 2013-10-15 ENCOUNTER — Encounter: Payer: Self-pay | Admitting: Geriatric Medicine

## 2013-10-15 DIAGNOSIS — I10 Essential (primary) hypertension: Secondary | ICD-10-CM

## 2013-10-15 DIAGNOSIS — G309 Alzheimer's disease, unspecified: Secondary | ICD-10-CM

## 2013-10-15 DIAGNOSIS — R1312 Dysphagia, oropharyngeal phase: Secondary | ICD-10-CM

## 2013-10-15 DIAGNOSIS — F0280 Dementia in other diseases classified elsewhere without behavioral disturbance: Secondary | ICD-10-CM

## 2013-10-15 NOTE — Progress Notes (Signed)
Patient ID: [name], female   DOB: [birth year], 80 y.o.   MRN: [id number]   [facility] [phone])   Contact Information   Name Relation Home Work Mobile   [name],[name] Daughter   [phone]       Chief Complaint  Patient presents with  . Failure To Thrive  . Alzheimer disease    HPI: This is a 80 y.o. female resident of [facility], Assisted Living section currently residing in the rehabilitation section due to declining cognitive and functional status.   Last visit: FTT (failure to thrive) in adult Continues with poor PO intake, limited activity. Continue to provide support, encourage activity and PO intake as she tolerates Alzheimer's disease Severe stage with minimal communication and activity.  ESSENTIAL HYPERTENSION BP range 117- 144/63-78, P 65-88 last several days, continue metoprolol, lisinopril for now. Dysphagia, oropharyngeal phase ST reports patient coughs with any consistency liquids, no sign of respiratory compromise today. Continue PO food/ fluids as she tolerates Unspecified constipation No BMs for several days earlier this week, Senokot-S qhs started with good result Rheumatoid arthritis(714.0) Patient with significant decline since last visit with Dr.Hawkes/Erin Wallace Cullens, [place]. Will send message asking whether methotrexate/ prednisone to be continued  Since last visit patient has had some mildly improved p.o. intake; when she is awake she may eat up to 50% of the meal. There has been no further vomiting. Nurtse note patient has upper airway congestion especially after sleeping. Patient is sleeping a good part of each day, when she is awake she is alert and interactive. PT has attempted interventions, patient's response fluctuates. Dr. Nickola Major has discontinued methotrexate recommend continuing prednisone. No complaints of joint pain or swelling.    Allergies  Allergen Reactions  . Codeine     MEDICATIONS -   reviewed  DATA REVIEWED  Radiologic Exams:   Laboratory Studies: Lab Results  Component Value Date   WBC 11.7 09/17/2013   HGB 11.7* 09/17/2013   HCT 34* 09/17/2013   MCV 96.8 01/23/2010   PLT 193 09/17/2013   Lab Results  Component Value Date   NA 133* 09/21/2013   K 3.9 09/21/2013   GLU 56 09/21/2013   BUN 15 09/21/2013   CREATININE 0.9 09/21/2013     REVIEW OF SYSTEMS  DATA OBTAINED: from patient, nurse, medical record, caregiver GENERAL: Feels "OK"   See HPI.  No weight this week RESPIRATORY: No cough, wheezing, SOB.  CARDIAC: No chest pain, palpitations. No edema GI: No abdominal pain  No Nausea,vomiting,diarrhea or constipation. Regular small bowel movements since Senokot started  No heartburn or reflux.  MUSCULOSKELETAL: No joint pain, swelling or stiffness   No back pain    Diffuse muscle weakness, requires Hoyer lift for transfers  NEUROLOGIC: No dizziness, fainting, headache. Fluctuating LOC - see HPI  PSYCHIATRIC: No signs of anxiety,  Sleeps well   No behavior issue   PHYSICAL EXAM Filed Vitals:   10/15/13 1223  BP: 159/74  Pulse: 86  Temp: 97.5 F (36.4 C)  Resp: 18  Weight: 97 lb 12.8 oz (44.362 kg)  SpO2: 97%   Body mass index is 20.09 kg/(m^2).  GENERAL APPEARANCE: No acute distress, appropriately groomed, Frail body habitus. Alert, pleasant, participates in brief conversation.  SKIN: No diaphoresis, rash, HEAD: Normocephalic, atraumatic EYES: Conjunctiva/lids clear. Pupils round, reactive.  EARS: Poor Hearing (chronic) NOSE: No deformity or discharge.  MOUTH/THROAT: Lips w/o lesions. Oral mucosa, tongue moist, w/o lesion.  LYMPHATICS: No head, neck or supraclavicular adenopathy  RESPIRATORY: Breathing is even, unlabored. Lung sounds are clear anterior, weak cough CARDIOVASCULAR: Heart RRR. No murmur or extra heart sounds  EDEMA: No peripheral edema. GASTROINTESTINAL: Abdomen is soft, non-tender, not distended w/ normal bowel sounds.  NEUROLOGIC: Not Oriented  to time, place,  Speech very soft, sometimes mumbles, no tremor.  PSYCHIATRIC: Mood and affect appropriate to situation, engaged in visit   ASSESSMENT/PLAN  ESSENTIAL HYPERTENSION Recent blood pressure range 131-159/66-83, heart rate 72 - 88. Continue current medications  Dysphagia, oropharyngeal phase The patient is tolerating small amounts of regular diet without significant coughing at meals. She does have some upper airway congestion and appears to have some difficulty managing secretions at times. Precautions are in place for maximum safety. Speech therapy has instructed patient on use of small breathing instrument.  Alzheimer's disease Patient with severe stage Alzheimer's disease, she has fluctuating levels of alertness, when awake she is interactive , eating small amount and enjoys brief outings outside her room. Continue supportive care, goal is maintenance of quality of life, no life prolonging measures. Continues to require skilled level of care, anticipate this will be permanent.    Family/ staff Communication:      Labs/tests ordered:     Follow up: Return for Routine/as needed.  Toribio Harbour, NP-C [facility] [phone]  10/15/2013

## 2013-10-18 NOTE — Assessment & Plan Note (Addendum)
Patient with severe stage Alzheimer's disease, she has fluctuating levels of alertness, when awake she is interactive , eating small amount and enjoys brief outings outside her room. Continue supportive care, goal is maintenance of quality of life, no life prolonging measures. Continues to require skilled level of care, anticipate this will be permanent.

## 2013-10-18 NOTE — Assessment & Plan Note (Signed)
Recent blood pressure range 131-159/66-83, heart rate 72 - 88. Continue current medications

## 2013-10-18 NOTE — Assessment & Plan Note (Signed)
The patient is tolerating small amounts of regular diet without significant coughing at meals. She does have some upper airway congestion and appears to have some difficulty managing secretions at times. Precautions are in place for maximum safety. Speech therapy has instructed patient on use of small breathing instrument.

## 2013-10-20 ENCOUNTER — Encounter: Payer: Self-pay | Admitting: Geriatric Medicine

## 2013-10-20 ENCOUNTER — Non-Acute Institutional Stay (SKILLED_NURSING_FACILITY): Payer: Medicare Other | Admitting: Geriatric Medicine

## 2013-10-20 ENCOUNTER — Encounter: Payer: Self-pay | Admitting: Adult Health

## 2013-10-20 DIAGNOSIS — K6289 Other specified diseases of anus and rectum: Secondary | ICD-10-CM

## 2013-10-20 DIAGNOSIS — R627 Adult failure to thrive: Secondary | ICD-10-CM

## 2013-10-20 DIAGNOSIS — K649 Unspecified hemorrhoids: Secondary | ICD-10-CM | POA: Insufficient documentation

## 2013-10-20 NOTE — Progress Notes (Signed)
Patient ID: Lendon Ka, female   DOB: 12/11/1923, 80 y.o.   MRN: [id number]   [facility] [phone])   Contact Information   Name Relation Home Work Mobile   [name],[name] Daughter   [phone]       Chief Complaint  Patient presents with  . Rectal Pain    HPI: This is a 80 y.o. female resident of [facility], residing in [place] section due to declining cognitive/ functional status.  Nurse repots patient c/o rectal pian with BM. Caregiver reports multiple BMs during the day, soft, brown stool.      Allergies  Allergen Reactions  . Codeine     MEDICATIONS -  Reviewed  DATA REVIEWED  Radiologic Exams:   Laboratory Studies:   REVIEW OF SYSTEMS  DATA OBTAINED: from patient, nurse, medical record, caregiver GENERAL: Feels "OK"   See HPI.  No weight this week RESPIRATORY: No cough, wheezing, SOB.  CARDIAC: No chest pain, palpitations. No edema GI: No abdominal pain  No Nausea,vomiting,diarrhea or constipation. Regular small bowel movements since Senokot started  No heartburn or reflux.  MUSCULOSKELETAL: No joint pain, swelling or stiffness   No back pain    Diffuse muscle weakness, requires Hoyer lift for transfers  NEUROLOGIC: No dizziness, fainting, headache. Fluctuating LOC - see HPI  PSYCHIATRIC: No signs of anxiety,  Sleeps well   No behavior issue  PHYSICAL EXAM Filed Vitals:   10/20/13 1102  BP: 143/76  Pulse: 91  Temp: 97.7 F (36.5 C)  Resp: 20  SpO2: 93%   There is no weight on file to calculate BMI.  GENERAL APPEARANCE: No acute distress, appropriately groomed, Frail body habitus. Alert, pleasant, participates in brief conversation.  SKIN: No diaphoresis, rash, HEAD: Normocephalic, atraumatic EYES: Conjunctiva/lids clear. EARS: Poor Hearing (chronic) NOSE: No deformity or discharge.  MOUTH/THROAT: Lips w/o lesions. Oral mucosa, tongue moist, w/o lesion.  LYMPHATICS: No head, neck or supraclavicular  adenopathy RESPIRATORY: Breathing is even, unlabored. Lung sounds are clear anterior, weak cough CARDIOVASCULAR: Heart RRR. No murmur or extra heart sounds  EDEMA: No peripheral edema. GASTROINTESTINAL: Abdomen is soft, non-tender, not distended w/ normal bowel sounds.   RECTAL: Rectal exam very uncomfortable for patient, no no mass, anorectal tissue is edematous, no obvious fissure. There is significant soft stool in the rectal vault. There are skin tags, no thrombosed external hemorrhoids. Sphincter tone WNL. Stool is brown, heme negative NEUROLOGIC: Not Oriented to time, place,  Speech very soft, sometimes mumbles, no tremor.  PSYCHIATRIC: Mood and affect appropriate to situation, engaged in visit  ASSESSMENT/PLAN  Rectal pain Very frail, weak patient with retained soft stool. Give enema today and follow progress.  FTT (failure to thrive) in adult P.o. intake continues to be poor, patient is more interactive when awake. Continue supportive measures and appropriate stimulation when she is awake    Family/ staff Communication:     Labs/tests ordered:    Follow up: Return for Routine/as needed.  Toribio Harbour, NP-C [facility] [phone]  10/20/2013

## 2013-10-23 DIAGNOSIS — K6289 Other specified diseases of anus and rectum: Secondary | ICD-10-CM | POA: Insufficient documentation

## 2013-10-23 NOTE — Assessment & Plan Note (Signed)
Very frail, weak patient with retained soft stool. Give enema today and follow progress.

## 2013-10-23 NOTE — Assessment & Plan Note (Signed)
P.o. intake continues to be poor, patient is more interactive when awake. Continue supportive measures and appropriate stimulation when she is awake

## 2013-11-05 ENCOUNTER — Encounter: Payer: Self-pay | Admitting: Geriatric Medicine

## 2013-11-05 ENCOUNTER — Non-Acute Institutional Stay (SKILLED_NURSING_FACILITY): Payer: Medicare Other | Admitting: Geriatric Medicine

## 2013-11-05 DIAGNOSIS — R627 Adult failure to thrive: Secondary | ICD-10-CM

## 2013-11-05 DIAGNOSIS — G309 Alzheimer's disease, unspecified: Principal | ICD-10-CM

## 2013-11-05 DIAGNOSIS — F0280 Dementia in other diseases classified elsewhere without behavioral disturbance: Secondary | ICD-10-CM

## 2013-11-05 MED ORDER — SCOPOLAMINE 1 MG/3DAYS TD PT72
1.0000 | MEDICATED_PATCH | TRANSDERMAL | Status: AC
Start: 2013-11-04 — End: ?

## 2013-11-05 MED ORDER — MORPHINE SULFATE 20 MG/5ML PO SOLN: 5.0000 mg | ORAL | Status: AC | PRN

## 2013-11-16 NOTE — Assessment & Plan Note (Addendum)
Patient is now at end-stage Alzheimer's disease, unresponsive this morning, tachypneic and tachycardic. Add lorazepam to decrease work of breathing and tachycardia. Daughter was in to visit last night will be arriving this morning as well. Long-term caregiver is at bedside. D/C non coomfort meds. Anticipate end-of-life in 24-48 hours.

## 2013-11-16 NOTE — Progress Notes (Signed)
Patient ID: [name], female   DOB: May 22, 1924, 80 y.o.   MRN: [id number] Phone call from nurse approximately one hour after my visit this morning. Patient died quietly while her son was visiting.

## 2013-11-16 NOTE — Assessment & Plan Note (Signed)
Further decline related to this Alzheimer's disease, limited p.o. intake.

## 2013-11-16 NOTE — Progress Notes (Signed)
Patient ID: [name], female   DOB: 1924/04/05, 80 y.o.   MRN: [id number]   [facility] [phone])   Contact Information   Name Relation Home Work Mobile   [name],[name] Daughter   [phone]       Chief Complaint  Patient presents with  . Alzheimer disease  . Failure To Thrive    HPI: This is a 80 y.o. female resident of [facility] Assisted Living section currently residing in the rehabilitation section due to declining cognitive and functional status.   Last visit: ESSENTIAL HYPERTENSION Recent blood pressure range 131-159/66-83, heart rate 72 - 88. Continue current medications Dysphagia, oropharyngeal phase The patient is tolerating small amounts of regular diet without significant coughing at meals. She does have some upper airway congestion and appears to have some difficulty managing secretions at times. Precautions are in place for maximum safety. Speech therapy has instructed patient on use of small breathing instrument. Alzheimer's disease Patient with severe stage Alzheimer's disease, she has fluctuating levels of alertness, when awake she is interactive , eating small amount and enjoys brief outings outside her room. Continue supportive care, goal is maintenance of quality of life, no life prolonging measures. Continues to require skilled level of care, anticipate this will be permanent.  Since last visit patient continued to have fluctuating levels of alertness, was eating small amounts. When awake was interactive. Last few days she was less interactive, eating very little. Yesterday she had an abrupt change in her condition with decreased level of consciousness and periods of apnea. She developed some abdominal/rectal discomfort, upper airway secretions were increased. She was treated with doses of p.o. morphine as well as a scopolamine patch. Patient reportedly slept well and comfortably through the evening and night.    This morning patient is nearly not responsive, respirations and heart rate are very elevated.    Allergies  Allergen Reactions  . Codeine     MEDICATIONS -  reviewed  DATA REVIEWED  Radiologic Exams:   Laboratory Studies: Lab Results  Component Value Date   WBC 11.7 09/17/2013   HGB 11.7* 09/17/2013   HCT 34* 09/17/2013   MCV 96.8 01/23/2010   PLT 193 09/17/2013   Lab Results  Component Value Date   NA 133* 09/21/2013   K 3.9 09/21/2013   GLU 56 09/21/2013   BUN 15 09/21/2013   CREATININE 0.9 09/21/2013     REVIEW OF SYSTEMS  DATA OBTAINED: from patient, nurse, medical record, caregiver GENERAL:  See HPI.   RESPIRATORY: No cough, wheezing,.  GI: LBM 5/20 NEUROLOGIC: Decreased LOC - see HPI  PSYCHIATRIC: No sign of anxiety   PHYSICAL EXAM There were no vitals filed for this visit. There is no weight on file to calculate BMI.  GENERAL APPEARANCE: No acute distress, appropriately groomed, Frail body habitus, non responsive to voice or touch.  SKIN: No diaphoresis, rash, NOSE: No deformity or discharge.  MOUTH/THROAT: Lips w/o lesions. Oral mucosa, tongue dry, fruity odor to breath RESPIRATORY: Tachypneic (>35/minute). Lung sounds are clear anterior CARDIOVASCULAR: Heart RRR. No murmur or extra heart sounds  EDEMA: No peripheral edema. GASTROINTESTINAL: Abdomen is soft, non-tender, not distended w/ decreased bowel sounds.     ASSESSMENT/PLAN  Alzheimer's disease Patient is now at end-stage Alzheimer's disease, unresponsive this morning, tachypneic and tachycardic. Add lorazepam to decrease work of breathing and tachycardia. Daughter was in to visit last night will be arriving this morning as well. Long-term caregiver is at bedside. D/C non  [place] meds. Anticipate end-of-life in 24-48 hours.  FTT (failure to thrive) in adult Further decline related to this Alzheimer's disease, limited p.o. intake.    Family/ staff Communication:      Labs/tests ordered:  Discussed EOL  care, medication changes with staff / caregiver   Follow up: Return for As needed.  Toribio Harbour, NP-C [facility] [phone]  2013-11-06

## 2013-11-16 DEATH — deceased

## 2013-12-09 ENCOUNTER — Encounter: Payer: Self-pay | Admitting: Geriatric Medicine
